# Patient Record
Sex: Male | Born: 1945 | Race: Black or African American | Hispanic: No | Smoking: Never smoker
Health system: Southern US, Community
[De-identification: ages and names within clinical notes are randomized; demographics above are authoritative.]

## PROBLEM LIST (undated history)

## (undated) DIAGNOSIS — Z1621 Resistance to vancomycin: Secondary | ICD-10-CM

## (undated) DIAGNOSIS — J449 Chronic obstructive pulmonary disease, unspecified: Secondary | ICD-10-CM

## (undated) DIAGNOSIS — E119 Type 2 diabetes mellitus without complications: Secondary | ICD-10-CM

## (undated) DIAGNOSIS — R569 Unspecified convulsions: Secondary | ICD-10-CM

## (undated) DIAGNOSIS — A491 Streptococcal infection, unspecified site: Secondary | ICD-10-CM

## (undated) DIAGNOSIS — J9610 Chronic respiratory failure, unspecified whether with hypoxia or hypercapnia: Secondary | ICD-10-CM

## (undated) DIAGNOSIS — Z1612 Extended spectrum beta lactamase (ESBL) resistance: Secondary | ICD-10-CM

## (undated) DIAGNOSIS — A4902 Methicillin resistant Staphylococcus aureus infection, unspecified site: Secondary | ICD-10-CM

## (undated) DIAGNOSIS — I639 Cerebral infarction, unspecified: Secondary | ICD-10-CM

## (undated) DIAGNOSIS — A499 Bacterial infection, unspecified: Secondary | ICD-10-CM

## (undated) HISTORY — PX: OTHER SURGICAL HISTORY: SHX169

## (undated) HISTORY — PX: PEG TUBE PLACEMENT: SUR1034

---

## 2018-03-09 ENCOUNTER — Encounter (HOSPITAL_COMMUNITY): Payer: Self-pay

## 2018-03-09 ENCOUNTER — Inpatient Hospital Stay (HOSPITAL_COMMUNITY)
Admission: EM | Admit: 2018-03-09 | Discharge: 2018-03-23 | DRG: 870 | Disposition: A | Discharge status: Other Acute Inpatient Hospital | Payer: Medicare Other | Attending: Internal Medicine | Admitting: Internal Medicine

## 2018-03-09 ENCOUNTER — Emergency Department (HOSPITAL_COMMUNITY): Payer: Medicare Other

## 2018-03-09 DIAGNOSIS — E11649 Type 2 diabetes mellitus with hypoglycemia without coma: Secondary | ICD-10-CM | POA: Diagnosis not present

## 2018-03-09 DIAGNOSIS — I69354 Hemiplegia and hemiparesis following cerebral infarction affecting left non-dominant side: Secondary | ICD-10-CM

## 2018-03-09 DIAGNOSIS — Z9911 Dependence on respirator [ventilator] status: Secondary | ICD-10-CM

## 2018-03-09 DIAGNOSIS — Z930 Tracheostomy status: Secondary | ICD-10-CM | POA: Diagnosis not present

## 2018-03-09 DIAGNOSIS — Z89611 Acquired absence of right leg above knee: Secondary | ICD-10-CM

## 2018-03-09 DIAGNOSIS — J9611 Chronic respiratory failure with hypoxia: Secondary | ICD-10-CM

## 2018-03-09 DIAGNOSIS — J440 Chronic obstructive pulmonary disease with acute lower respiratory infection: Secondary | ICD-10-CM | POA: Diagnosis not present

## 2018-03-09 DIAGNOSIS — A419 Sepsis, unspecified organism: Principal | ICD-10-CM | POA: Diagnosis present

## 2018-03-09 DIAGNOSIS — I959 Hypotension, unspecified: Secondary | ICD-10-CM | POA: Diagnosis present

## 2018-03-09 DIAGNOSIS — Y95 Nosocomial condition: Secondary | ICD-10-CM | POA: Diagnosis not present

## 2018-03-09 DIAGNOSIS — J189 Pneumonia, unspecified organism: Secondary | ICD-10-CM | POA: Diagnosis present

## 2018-03-09 DIAGNOSIS — J9621 Acute and chronic respiratory failure with hypoxia: Secondary | ICD-10-CM | POA: Diagnosis not present

## 2018-03-09 DIAGNOSIS — L89154 Pressure ulcer of sacral region, stage 4: Secondary | ICD-10-CM | POA: Diagnosis present

## 2018-03-09 DIAGNOSIS — Z66 Do not resuscitate: Secondary | ICD-10-CM | POA: Diagnosis present

## 2018-03-09 DIAGNOSIS — R253 Fasciculation: Secondary | ICD-10-CM | POA: Diagnosis present

## 2018-03-09 DIAGNOSIS — R6520 Severe sepsis without septic shock: Secondary | ICD-10-CM | POA: Diagnosis present

## 2018-03-09 DIAGNOSIS — E039 Hypothyroidism, unspecified: Secondary | ICD-10-CM | POA: Diagnosis not present

## 2018-03-09 DIAGNOSIS — I69321 Dysphasia following cerebral infarction: Secondary | ICD-10-CM

## 2018-03-09 DIAGNOSIS — I10 Essential (primary) hypertension: Secondary | ICD-10-CM | POA: Diagnosis not present

## 2018-03-09 DIAGNOSIS — G40909 Epilepsy, unspecified, not intractable, without status epilepticus: Secondary | ICD-10-CM | POA: Diagnosis present

## 2018-03-09 DIAGNOSIS — L8944 Pressure ulcer of contiguous site of back, buttock and hip, stage 4: Secondary | ICD-10-CM | POA: Diagnosis present

## 2018-03-09 DIAGNOSIS — D649 Anemia, unspecified: Secondary | ICD-10-CM | POA: Diagnosis present

## 2018-03-09 DIAGNOSIS — Z794 Long term (current) use of insulin: Secondary | ICD-10-CM

## 2018-03-09 DIAGNOSIS — N390 Urinary tract infection, site not specified: Secondary | ICD-10-CM

## 2018-03-09 DIAGNOSIS — R000 Tachycardia, unspecified: Secondary | ICD-10-CM | POA: Diagnosis present

## 2018-03-09 DIAGNOSIS — B370 Candidal stomatitis: Secondary | ICD-10-CM | POA: Diagnosis present

## 2018-03-09 DIAGNOSIS — J181 Lobar pneumonia, unspecified organism: Secondary | ICD-10-CM | POA: Diagnosis present

## 2018-03-09 DIAGNOSIS — Z681 Body mass index (BMI) 19 or less, adult: Secondary | ICD-10-CM | POA: Diagnosis not present

## 2018-03-09 DIAGNOSIS — Z933 Colostomy status: Secondary | ICD-10-CM

## 2018-03-09 DIAGNOSIS — E876 Hypokalemia: Secondary | ICD-10-CM | POA: Diagnosis not present

## 2018-03-09 DIAGNOSIS — K9423 Gastrostomy malfunction: Secondary | ICD-10-CM | POA: Diagnosis not present

## 2018-03-09 DIAGNOSIS — Z86718 Personal history of other venous thrombosis and embolism: Secondary | ICD-10-CM

## 2018-03-09 DIAGNOSIS — J9601 Acute respiratory failure with hypoxia: Secondary | ICD-10-CM | POA: Diagnosis present

## 2018-03-09 DIAGNOSIS — L8990 Pressure ulcer of unspecified site, unspecified stage: Secondary | ICD-10-CM | POA: Diagnosis present

## 2018-03-09 DIAGNOSIS — R569 Unspecified convulsions: Secondary | ICD-10-CM

## 2018-03-09 HISTORY — DX: Methicillin resistant Staphylococcus aureus infection, unspecified site: A49.02

## 2018-03-09 HISTORY — DX: Resistance to vancomycin: A49.1

## 2018-03-09 HISTORY — DX: Cerebral infarction, unspecified: I63.9

## 2018-03-09 HISTORY — DX: Unspecified convulsions: R56.9

## 2018-03-09 HISTORY — DX: Resistance to vancomycin: Z16.21

## 2018-03-09 HISTORY — DX: Chronic obstructive pulmonary disease, unspecified: J44.9

## 2018-03-09 HISTORY — DX: Extended spectrum beta lactamase (ESBL) resistance: Z16.12

## 2018-03-09 HISTORY — DX: Bacterial infection, unspecified: A49.9

## 2018-03-09 HISTORY — DX: Type 2 diabetes mellitus without complications: E11.9

## 2018-03-09 HISTORY — DX: Chronic respiratory failure, unspecified whether with hypoxia or hypercapnia: J96.10

## 2018-03-09 LAB — CBC WITH DIFFERENTIAL/PLATELET
BAND NEUTROPHILS: 0 %
BASOS ABS: 0 10*3/uL (ref 0.0–0.1)
BLASTS: 0 %
Basophils Relative: 0 %
EOS ABS: 0 10*3/uL (ref 0.0–0.7)
EOS PCT: 0 %
HEMATOCRIT: 36.1 % — AB (ref 39.0–52.0)
Hemoglobin: 11 g/dL — ABNORMAL LOW (ref 13.0–17.0)
LYMPHS ABS: 0.9 10*3/uL (ref 0.7–4.0)
LYMPHS PCT: 4 %
MCH: 28.9 pg (ref 26.0–34.0)
MCHC: 30.5 g/dL (ref 30.0–36.0)
MCV: 95 fL (ref 78.0–100.0)
METAMYELOCYTES PCT: 0 %
MONOS PCT: 7 %
Monocytes Absolute: 1.6 10*3/uL — ABNORMAL HIGH (ref 0.1–1.0)
Myelocytes: 0 %
NEUTROS ABS: 20.1 10*3/uL — AB (ref 1.7–7.7)
Neutrophils Relative %: 89 %
OTHER: 0 %
PLATELETS: INCREASED 10*3/uL (ref 150–400)
Promyelocytes Absolute: 0 %
RBC: 3.8 MIL/uL — ABNORMAL LOW (ref 4.22–5.81)
RDW: 17.4 % — AB (ref 11.5–15.5)
WBC: 22.6 10*3/uL — ABNORMAL HIGH (ref 4.0–10.5)
nRBC: 0 /100 WBC

## 2018-03-09 LAB — URINALYSIS, ROUTINE W REFLEX MICROSCOPIC
Bilirubin Urine: NEGATIVE
Glucose, UA: NEGATIVE mg/dL
Ketones, ur: NEGATIVE mg/dL
NITRITE: NEGATIVE
PROTEIN: 100 mg/dL — AB
SPECIFIC GRAVITY, URINE: 1.021 (ref 1.005–1.030)
pH: 5 (ref 5.0–8.0)

## 2018-03-09 LAB — I-STAT ARTERIAL BLOOD GAS, ED
ACID-BASE EXCESS: 4 mmol/L — AB (ref 0.0–2.0)
Bicarbonate: 28.6 mmol/L — ABNORMAL HIGH (ref 20.0–28.0)
O2 Saturation: 99 %
PH ART: 7.446 (ref 7.350–7.450)
Patient temperature: 99.5
TCO2: 30 mmol/L (ref 22–32)
pCO2 arterial: 41.8 mmHg (ref 32.0–48.0)
pO2, Arterial: 161 mmHg — ABNORMAL HIGH (ref 83.0–108.0)

## 2018-03-09 LAB — COMPREHENSIVE METABOLIC PANEL
ALBUMIN: 1.8 g/dL — AB (ref 3.5–5.0)
ALT: 36 U/L (ref 17–63)
ANION GAP: 9 (ref 5–15)
AST: 54 U/L — AB (ref 15–41)
Alkaline Phosphatase: 179 U/L — ABNORMAL HIGH (ref 38–126)
BUN: 72 mg/dL — AB (ref 6–20)
CO2: 26 mmol/L (ref 22–32)
Calcium: 9.3 mg/dL (ref 8.9–10.3)
Chloride: 107 mmol/L (ref 101–111)
Creatinine, Ser: 0.69 mg/dL (ref 0.61–1.24)
GFR calc Af Amer: >60 mL/min (ref >60)
GFR calc non Af Amer: >60 mL/min (ref >60)
GLUCOSE: 187 mg/dL — AB (ref 65–99)
POTASSIUM: 5.2 mmol/L — AB (ref 3.5–5.1)
Sodium: 142 mmol/L (ref 135–145)
Total Bilirubin: 1.1 mg/dL (ref 0.3–1.2)
Total Protein: 9.8 g/dL — ABNORMAL HIGH (ref 6.5–8.1)

## 2018-03-09 LAB — I-STAT CG4 LACTIC ACID, ED
LACTIC ACID, VENOUS: 2.15 mmol/L — AB (ref 0.5–1.9)
Lactic Acid, Venous: 3.1 mmol/L (ref 0.5–1.9)

## 2018-03-09 MED ORDER — VANCOMYCIN HCL IN DEXTROSE 750-5 MG/150ML-% IV SOLN
750.0000 mg | Freq: Once | INTRAVENOUS | Status: DC
  Filled 2018-03-09: qty 150

## 2018-03-09 MED ORDER — ALBUTEROL SULFATE (2.5 MG/3ML) 0.083% IN NEBU: 2.5000 mg | INHALATION_SOLUTION | RESPIRATORY_TRACT | Status: DC | PRN

## 2018-03-09 MED ORDER — SODIUM CHLORIDE 0.9 % IV SOLN
1.0000 g | Freq: Once | INTRAVENOUS | Status: AC
  Administered 2018-03-09: 1 g via INTRAVENOUS
  Filled 2018-03-09: qty 1

## 2018-03-09 MED ORDER — LACTATED RINGERS IV BOLUS
1000.0000 mL | Freq: Once | INTRAVENOUS | Status: AC
  Administered 2018-03-09: 1000 mL via INTRAVENOUS

## 2018-03-09 MED ORDER — PANTOPRAZOLE SODIUM 40 MG IV SOLR
40.0000 mg | INTRAVENOUS | Status: DC
  Administered 2018-03-10 – 2018-03-11 (×3): 40 mg via INTRAVENOUS
  Filled 2018-03-09 (×3): qty 40

## 2018-03-09 MED ORDER — IPRATROPIUM-ALBUTEROL 0.5-2.5 (3) MG/3ML IN SOLN
3.0000 mL | Freq: Four times a day (QID) | RESPIRATORY_TRACT | Status: DC
  Administered 2018-03-10 – 2018-03-14 (×17): 3 mL via RESPIRATORY_TRACT
  Filled 2018-03-09 (×16): qty 3

## 2018-03-09 MED ORDER — LACTATED RINGERS IV BOLUS
500.0000 mL | Freq: Once | INTRAVENOUS | Status: AC
  Administered 2018-03-09: 500 mL via INTRAVENOUS

## 2018-03-09 MED ORDER — NYSTATIN 100000 UNIT/ML MT SUSP
5.0000 mL | Freq: Four times a day (QID) | OROMUCOSAL | Status: DC
  Administered 2018-03-10 – 2018-03-20 (×41): 500000 [IU] via ORAL
  Filled 2018-03-09 (×43): qty 5

## 2018-03-09 MED ORDER — PIPERACILLIN-TAZOBACTAM 3.375 G IVPB 30 MIN
3.3750 g | Freq: Once | INTRAVENOUS | Status: AC
  Administered 2018-03-09: 3.375 g via INTRAVENOUS
  Filled 2018-03-09: qty 50

## 2018-03-09 MED ORDER — CHLORHEXIDINE GLUCONATE 0.12% ORAL RINSE (MEDLINE KIT)
15.0000 mL | Freq: Two times a day (BID) | OROMUCOSAL | Status: DC
  Administered 2018-03-10 – 2018-03-11 (×2): 15 mL via OROMUCOSAL

## 2018-03-09 MED ORDER — ORAL CARE MOUTH RINSE
15.0000 mL | Freq: Four times a day (QID) | OROMUCOSAL | Status: DC
  Administered 2018-03-10 – 2018-03-11 (×2): 15 mL via OROMUCOSAL

## 2018-03-09 MED ORDER — SODIUM CHLORIDE 0.9 % IV SOLN
1.0000 g | Freq: Three times a day (TID) | INTRAVENOUS | Status: AC
  Administered 2018-03-10 – 2018-03-15 (×18): 1 g via INTRAVENOUS
  Filled 2018-03-09 (×19): qty 1

## 2018-03-09 MED ORDER — LINEZOLID 600 MG/300ML IV SOLN
600.0000 mg | Freq: Two times a day (BID) | INTRAVENOUS | Status: AC
  Administered 2018-03-09 – 2018-03-15 (×13): 600 mg via INTRAVENOUS
  Filled 2018-03-09 (×14): qty 300

## 2018-03-09 NOTE — ED Notes (Signed)
I stat lactic acid reported to Dr. Clarene DukeLittle by B. Bing PlumeHaynes, EMT

## 2018-03-09 NOTE — ED Triage Notes (Signed)
Pt arrived via [facility] from [facility].  Spoke with [place] Mark from [facility] stated she advised transport d/t abnormal ABG, CBG 329, tachycardic at 145, and agonal labored breathing.  Reported pt is being treated for MRSA, VRE, ESBL in his sputum and urine.  Right arm PICC line, G-tube is leaking.

## 2018-03-09 NOTE — Consult Note (Signed)
Name: Joe Sandoval MRN: [id number] DOB: 1946-08-24    ADMISSION DATE:  03/09/2018 CONSULTATION DATE:  03/09/2018  REFERRING MD :  Dr. Clarene DukeLittle  CHIEF COMPLAINT:  Hypotension/ VDRF  HISTORY OF PRESENT ILLNESS:   HPI obtained from medical chart review as patient is trach/ VDRF, will not follow commands, and unable to participate in history.    72 year old male with past medical history of ventilator dependent respiratory failure s/p tracheostomy, G-tube dependent, COPD, CVA with residual left hemiplegia, DM, MDRO including EBSL, MRSA, VRE, and seizures who is a resident at [facility] with state gold DNR form present.  Patient who was sent for fever, respiratory distress including agonal breathing onset today.  Patient recently finished course of vancomycin and cefepime 5 days ago.    On arrival, patient was in no respiratory distress on MV and neurologically at baseline.  Concern for sepsis given fever, tachycardia, and borderline hypotension based on measurements taken on left lower leg.  CXR showing RLL inflitrate, UTI noted, WBC 22.6, K 5.2, sCr 0.69, lactate 2.15 -> 3.1. Cultures sent.  He was started on meropenum and linezolid and started on 30 ml/kg fluid resusitation with improvement in blood pressure.  At this time, patient does not meet ICU criteria and PCCM consulted for ventilator management.    PAST MEDICAL HISTORY :   has a past medical history of Chronic respiratory failure (HCC), COPD (chronic obstructive pulmonary disease) (HCC), Diabetes mellitus without complication (HCC), ESBL (extended spectrum beta-lactamase) producing bacteria infection, MRSA (methicillin resistant Staphylococcus aureus), Seizures (HCC), Stroke (HCC), and VRE (vancomycin-resistant Enterococci).  has no past surgical history on file. Prior to Admission medications   Medication Sig Start Date End Date Taking? Authorizing Provider  acetaminophen (TYLENOL) 325 MG tablet Place 650 mg into feeding tube every 4 (four)  hours as needed for fever (pain).   Yes [provider]  chlorhexidine (PERIDEX) 0.12 % solution 15 mLs See admin instructions. Give 15 mls by mouth every shift for attention to trach   Yes [provider]  [employer] Tar Extract (NEUTROGENA T/GEL) 4 % SHAM Apply 1 application topically See admin instructions. Apply to head topically at bedtime every Wednesday and Sunday (wash hair with bath)   Yes [provider]  dextrose 50 % solution Inject 12.5 g into the vein See admin instructions. Use 12.5 gram intravenously as needed for BS below 70 and patient has iv access   Yes [provider]  Dextrose-Fructose-Sod Citrate [id number] MG CHEW 1 tablet by PEG Tube route as needed (BS <70).   Yes [provider]  Enoxaparin Sodium (LOVENOX IJ) Inject 50 mg into the skin every 12 (twelve) hours.    Yes [provider]  FLORA-Q (FLORA-Q) CAPS capsule 1 capsule daily.   Yes [provider]  FOLIC ACID PO 5 mg by PEG Tube route daily. 5 mg tablets   Yes [provider]  glucagon (GLUCAGON EMERGENCY) 1 MG injection Inject 1 mg into the muscle as needed (BS <70 (use if patient cannot swallow)).   Yes [provider]  insulin lispro (HUMALOG) 100 UNIT/ML injection Inject 0-10 Units into the skin every 6 (six) hours. Per sliding scale: CBG 15-200 2 units, 201-250, 251-300 6 units, 301-350 8 units, 351-400 10 units   Yes [provider]  ipratropium-albuterol (DUONEB) 0.5-2.5 (3) MG/3ML SOLN Take 3 mLs by nebulization every 6 (six) hours.   Yes [provider]  levETIRAcetam (KEPPRA) 100 MG/ML solution Place 1,500 mg into  feeding tube 2 (two) times daily. For seizures   Yes [provider]  levothyroxine (SYNTHROID, LEVOTHROID) 50 MCG tablet Place 50 mcg into feeding tube daily.   Yes [provider]  LORazepam (ATIVAN) 2 MG tablet Place 2 mg into feeding tube every 4 (four) hours as needed for seizure.   Yes  [provider]  metoprolol tartrate (LOPRESSOR) 50 MG tablet Place 50 mg into feeding tube 2 (two) times daily. Hold for BP less than 100   Yes [provider]  morphine 20 MG/5ML solution Place 4 mg into feeding tube every 4 (four) hours as needed for pain (agitation).   Yes [provider]  Multiple Vitamin (MULTIVITAMIN WITH MINERALS) TABS tablet Place 1 tablet into feeding tube daily.   Yes [provider]  nystatin (NYSTATIN) powder Apply topically See admin instructions. Apply to colostomy site topically every day shift every 3 days for colostomy change   Yes [provider]  ondansetron (ZOFRAN) 4 MG tablet Place 4 mg into feeding tube every 4 (four) hours as needed for nausea or vomiting.   Yes [provider]  pantoprazole sodium (PROTONIX) 40 mg/20 mL PACK Place 40 mg into feeding tube daily.   Yes [provider]  phenytoin (DILANTIN) 125 MG/5ML suspension Place 100 mg into feeding tube every 8 (eight) hours. For seizures   Yes [provider]  polyethylene glycol (MIRALAX / GLYCOLAX) packet Place 17 g into feeding tube daily.   Yes [provider]  QUEtiapine (SEROQUEL) 25 MG tablet Place 25 mg into feeding tube 2 (two) times daily. For agitation and restlessness   Yes [provider]  Skin Protectants, Misc. (EUCERIN) cream Apply 1 application topically See admin instructions. Apply topically to dry skin every shift   Yes [provider]  Sodium Chloride Flush (NORMAL SALINE FLUSH IV) Inject 10 mLs into the vein See admin instructions. Use 10 ml intravenously every shift for PICC Valved Catheter. Flush each unused lumen.   Yes [provider]  Sodium Chloride, Inhalant, (HYPER-SAL) 7 % NEBU Inhale 1 vial into the lungs every 6 (six) hours.   Yes [provider]  traMADol (ULTRAM) 50 MG tablet Place 50 mg into feeding tube every 6 (six) hours as needed (pain 4-6).   Yes  [provider]   Allergies  Allergen Reactions  . Iodine Other (See Comments)    Unknown reaction (listed on [facility] paperwork)  . Shellfish Allergy Other (See Comments)    Unknown reaction (listed on [facility] Paperwork    FAMILY HISTORY:  family history is not on file. SOCIAL HISTORY:  reports that he drank alcohol. He reports that he has current or past drug history.  REVIEW OF SYSTEMS:   Unable to assess as patient is non-verbal, does not follow commands, and is mechanically ventilated.   SUBJECTIVE:  S/p 1L LR  VITAL SIGNS: Temp:  [99.5 F (37.5 C)] 99.5 F (37.5 C) (03/25 1908) Pulse Rate:  [71-133] 123 (03/25 2231) Resp:  [17-26] 20 (03/25 2231) BP: (74-143)/(34-81) 142/61 (03/25 2231) SpO2:  [99 %-100 %] 100 % (03/25 2231) FiO2 (%):  [40 %] 40 % (03/25 2038) Weight:  [111 lb (50.3 kg)-111 lb 12.8 oz (50.7 kg)] 111 lb (50.3 kg) (03/25 2024)  PHYSICAL EXAMINATION: General:  Chronically ill appearing male in NAD on MV  HEENT: MM pink/heavy white coating to tongue- does not scrape off, pupils 3/ equal, 6 shiley XLT-D midline Neuro: Eyes open, looks around room,  moves RUE actively- will localize, flaccid left, does not follow commands CV: ST, +1 pulses PULM: even/non-labored on MV, tachypneic, lungs bilaterally diminished, faint expiratory wheeze with scattered rhonchi/rales, thick green sputum suctioned GI: soft, non-tender, bs active, Gtube to upper abd with leakage and excoriation around site, LLQ ostomy with mustard colored liquid stool  Extremities: warm/dry, mild generalized edema, R AKA  Skin: no rashes, 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 FINDINGS: Endotracheal tube 5.8 cm above the carina. Right PICC line tip in the SVC. Heart is normal size. Mild hyperinflation of the lungs. Patchy opacity at the right lung base. Left lung clear. No effusions. No acute bony abnormality. IMPRESSION: Mild hyperinflation. Patchy atelectasis or infiltrate at the right lung base. Electronically Signed   By: Charlett Nose M.D.   On: 03/09/2018 18:44   SIGNIFICANT EVENTS  3/25 Admitted  STUDIES:  CXR 3/25 >> mild hyperinflation; patchy atelectasis or infiltrate at right lung base  CULTURES:  3/25 BC x2 >> 3/25 UC >> 3/25 trach aspirate >>  ANTIBIOTICS: pta Vanc and cefepime 3/25 linezolid >> 3/25 meropenem >>  Lines/drains: (all from outside facility) DL R PICC Foley Shiley 6 trach XLT distal Gtube Ostomy  PIV x 1   BRIEF PATIENT DESCRIPTION:  72 yoM, w/PMH of DNR, VDRF/ trach/ MDRO, Gtube dependent with [facility], agonal ?on MV with respiratory distress, presented to ER in no distress, at baseline mental status with soft BP (taken on lower extremity), tachycardic, lactate of 2.15-> 3.1 with concern for Sepsis found to have RLL PNA, UTI, started on 6ml/kg fluid bolus with improvement in BP to baseline and on LUE.     ASSESSMENT / PLAN:  Chronic respiratory failure with tracheostomy and VDRF HCAP- hx of MDS, s/p course of vanc and cefepime at [facility] Oral thrush  COPD - CXR with satisfactory position of trach, RLL infiltrate  P:  Does not meet ICU criteria at this time, he is normotensive, DNR, would not start vasopressors  Continue full MV support, PRVC 8 cc/kg, rate 22 No SBT for now  CXR in am  ABG now Send for tracheal aspirate Agree with Meropenum and Zyvox and narrow as able given  culture data (given hx of VRE and ESBL, no records of culture data available at time of note) Assess PCT  Consider ID consult Nystatin orally  Trach care protocol  duonebs q 6hrs and albuterol PRN  PPI for SUP    Remainder per primary team    Global: No family at bedside.  ED unable to reach family.  Patient sent with valid state GOLD DNR form in which we will honor.   Posey Boyer, AGACNP-BC [facility] Pgr: [phone] or if no answer [phone] 03/09/2018, 10:53 PM

## 2018-03-09 NOTE — ED Provider Notes (Signed)
[facility] EMERGENCY DEPARTMENT Provider Note   CSN: [id number] Arrival date & time: 03/09/18  1751     History   Chief Complaint Chief Complaint  Patient presents with  . Respiratory Distress  . Tachycardia    HPI Joe Sandoval is a 72 y.o. male.  The history is provided by the EMS personnel and medical records.  Shortness of Breath  This is a recurrent problem. Duration: unclear. The current episode started less than 1 hour ago. The problem has been resolved (EMS on scene and critical care transport did not note reported agonal respirations that were reported. ). Associated symptoms include a fever (Documented fever at [facility] earlier today, got acetaminophen). Associated medical issues include COPD.  -Patient is a 72 year old male who is ventilator and G-tube dependent who presents from [facility] for reported agonal respirations and shortness of breath.  He also reportedly spiked a fever today.  Patient recently finished up a course of cefepime and vancomycin for pneumonia.  He also has severe decubitus wounds.  EMS on scene and local care transport did not note any agonal respirations or signs of respiratory distress during their time with the patient.  Past Medical History:  Diagnosis Date  . Chronic respiratory failure (HCC)   . COPD (chronic obstructive pulmonary disease) (HCC)   . Diabetes mellitus without complication (HCC)   . ESBL (extended spectrum beta-lactamase) producing bacteria infection   . MRSA (methicillin resistant Staphylococcus aureus)   . Seizures (HCC)   . Stroke (HCC)   . VRE (vancomycin-resistant Enterococci)     There are no active problems to display for this patient.   History reviewed. No pertinent surgical history.      Home Medications    Prior to Admission medications   Not on File    Family History History reviewed. No pertinent family history.  Social History Social History   Tobacco Use  . Smoking  status: Unknown If Ever Smoked  Substance Use Topics  . Alcohol use: Not Currently    Frequency: Never  . Drug use: Not Currently     Allergies   Iodine and Shellfish allergy   Review of Systems Review of Systems  Unable to perform ROS: Patient nonverbal  Constitutional: Positive for fever (Documented fever at [facility] earlier today, got acetaminophen).  Respiratory: Positive for shortness of breath.      Physical Exam Updated Vital Signs BP 118/81   Pulse (!) 116   Temp 99.5 F (37.5 C) (Rectal)   Resp 17   Ht 6' (1.829 m)   Wt 50.3 kg (111 lb)   SpO2 100%   BMI 15.05 kg/m   Physical Exam  Constitutional: He appears well-developed and well-nourished. He appears listless. No distress.  HENT:  Head: Normocephalic and atraumatic.  Oral thrush  Eyes: Conjunctivae are normal.  Neck: Neck supple.  Cardiovascular: Normal rate and regular rhythm.  Pulmonary/Chest: Effort normal and breath sounds normal. No respiratory distress. He has no wheezes. He has no rales.  Abdominal: Soft. He exhibits no distension. There is no tenderness. There is no guarding.  Gtube in place  Musculoskeletal: He exhibits no edema.  R AKA  Neurological: He appears listless.  Awake, looking around room.  Skin: Skin is warm and dry.  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  Does not appear acutely infected.  There is some redness and mild induration on the right side of the abdomen that may be cellulitic.  There is  also small cluster of ulcerations in the left groin.  Psychiatric: He has a normal mood and affect.  Nursing note and vitals reviewed.    ED Treatments / Results  Labs (all labs ordered are listed, but only abnormal results are displayed) Labs Reviewed  COMPREHENSIVE METABOLIC PANEL - Abnormal; Notable for the following components:      Result Value   Potassium 5.2 (*)    Glucose, Bld 187 (*)    BUN 72 (*)    Total Protein 9.8 (*)    Albumin 1.8 (*)     AST 54 (*)    Alkaline Phosphatase 179 (*)    All other components within normal limits  CBC WITH DIFFERENTIAL/PLATELET - Abnormal; Notable for the following components:   RBC 3.80 (*)    Hemoglobin 11.0 (*)    HCT 36.1 (*)    RDW 17.4 (*)    All other components within normal limits  URINALYSIS, ROUTINE W REFLEX MICROSCOPIC - Abnormal; Notable for the following components:   Color, Urine AMBER (*)    APPearance TURBID (*)    Hgb urine dipstick MODERATE (*)    Protein, ur 100 (*)    Leukocytes, UA LARGE (*)    Bacteria, UA FEW (*)    Squamous Epithelial / LPF 0-5 (*)    All other components within normal limits  I-STAT CG4 LACTIC ACID, ED - Abnormal; Notable for the following components:   Lactic Acid, Venous 2.15 (*)    All other components within normal limits  I-STAT CG4 LACTIC ACID, ED - Abnormal; Notable for the following components:   Lactic Acid, Venous 3.10 (*)    All other components within normal limits  CULTURE, BLOOD (ROUTINE X 2)  CULTURE, BLOOD (ROUTINE X 2)  URINE CULTURE  CULTURE, BLOOD (SINGLE)    EKG EKG Interpretation  Date/Time:  Monday March 09 2018 18:09:23 EDT Ventricular Rate:  136 PR Interval:    QRS Duration: 84 QT Interval:  304 QTC Calculation: 458 R Axis:   64 Text Interpretation:  Sinus tachycardia LAE, consider biatrial enlargement No previous ECGs available Confirmed by Frederick Peers [phone]) on 03/09/2018 7:27:10 PM   Radiology Dg Chest Port 1 View  Result Date: 03/09/2018 CLINICAL DATA:  Recent pneumonia, sepsis EXAM: PORTABLE CHEST 1 VIEW COMPARISON:  None. FINDINGS: Endotracheal tube 5.8 cm above the carina. Right PICC line tip in the SVC. Heart is normal size. Mild hyperinflation of the lungs. Patchy opacity at the right lung base. Left lung clear. No effusions. No acute bony abnormality. IMPRESSION: Mild hyperinflation. Patchy atelectasis or infiltrate at the right lung base. Electronically Signed   By: Charlett Nose M.D.   On:  03/09/2018 18:44    Procedures Procedures (including critical care time)  Medications Ordered in ED Medications  linezolid (ZYVOX) IVPB 600 mg (has no administration in time range)  meropenem (MERREM) 1 g in sodium chloride 0.9 % 100 mL IVPB (has no administration in time range)  meropenem (MERREM) 1 g in sodium chloride 0.9 % 100 mL IVPB (1 g Intravenous New Bag/Given 03/09/18 2023)  piperacillin-tazobactam (ZOSYN) IVPB 3.375 g (0 g Intravenous Stopped 03/09/18 1908)  lactated ringers bolus 1,000 mL (1,000 mLs Intravenous Given 03/09/18 1838)  lactated ringers bolus 500 mL (500 mLs Intravenous Given 03/09/18 1919)     Initial Impression / Assessment and Plan / ED Course  I have reviewed the triage vital signs and the nursing notes.  Pertinent labs & imaging results that were available  during my care of the patient were reviewed by me and considered in my medical decision making (see chart for details).     Patient is a 72 year old male with history of stroke, COPD, chronic respiratory failure on ventilator support, tracheostomy status, G-tube dependent, diabetes, ESBL, MRSA, VRE, seizures who presents with reported agonal breathing and tachycardia and fever.  Critical care transport did not note any agonal breathing and he is reportedly at his normal mental status.  Patient is afebrile here however he received Tylenol and morphine prior to arrival.  Blood pressure is soft.  No respiratory distress noted.  Patient recently finished a course of vancomycin yesterday and course of cefepime 5 days ago per the nursing home.  This was for treatment of pneumonia.    Given the patient is receiving and had reported respiratory symptoms and tachycardia, concern for sepsis.  He also has numerous multidrug-resistant organisms so we will cover broadly.  Initially vancomycin and Zosyn ordered, however pharmacy is recommending meropenem and linezolid.  This is approved through infectious disease.  Chest  x-ray does show possible right lower lobe pneumonia.  Urine has large leukocytes but negative nitrites and few bacteria.  Blood and urine culture sent.  Patient resuscitated with 1.5 L of LR which is 30 cc/KG.  Initial lactate was 2.15 and is slightly higher on repeat.  Blood pressure did improve with the fluid bolus.  Blood cultures also drawn from his PICC line to evaluate for line sepsis.  Patient likely has multiple sources of infection causing his sepsis including pneumonia, UTI, possible cellulitis on his anterior abdominal wall.  He has large stage IV pressure ulcers on his bilateral posterior hips and sacrum but they do not appear acutely infected.  Given the patient's comorbidities, sepsis, ventilator dependent, patient will need to be admitted to the ICU for further management.  Final Clinical Impressions(s) / ED Diagnoses   Final diagnoses:  HCAP (healthcare-associated pneumonia)  Urinary tract infection without hematuria, site unspecified  Sepsis, due to unspecified organism [facility](HCC)  Ventilator dependent [facility](HCC)    ED Discharge Orders    None       Dwana MelenaDong, Giovoni Bunch, DO 03/09/18 2028    Little, Ambrose Finlandachel Morgan, MD 03/11/18 [phone]

## 2018-03-09 NOTE — Progress Notes (Signed)
Pharmacy Antibiotic Note  Joe Sandoval is a 72 y.o. male admitted on 03/09/2018 from [facility] with abnormal ABG, hyperglycemia, tachycardia and agonal breathing.  Per RN at [facility], patient just completed a course of vancomycin and cefepime for PNA.  Pharmacy has been consulted for Merrem dosing to cover PNA, UTI and decub ulcer given history of ESBL.  ID also approved using Zyvox due to history of VRE and MRSA.  SCr 0.69, CrCL 60 ml/min, afebrile, LA increased to 3.1.   Plan: Zyvox  IV Q12H Merrem 1gm IV Q8H Monitor renal fxn, clinical progress   Height: 6' (182.9 cm) Weight: 111 lb (50.3 kg) IBW/kg (Calculated) : 77.6  Temp (24hrs), Avg:99.5 F (37.5 C), Min:99.5 F (37.5 C), Max:99.5 F (37.5 C)  Recent Labs  Lab 03/09/18 1831 03/09/18 1845 03/09/18 2025  WBC PENDING  --   --   CREATININE 0.69  --   --   LATICACIDVEN  --  2.15* 3.10*    Estimated Creatinine Clearance: 60.3 mL/min (by C-G formula based on SCr of 0.69 mg/dL).    Allergies  Allergen Reactions  . Iodine   . Shellfish Allergy     Merrem 3/25 >> Zyvox 3/25 >>  3/25 BCx -  3/25 UCx -   Info from RN at [facility]: Allergy: shellfish and iodine - unknown reaction 111.8 lbs, 6' (right leg amputation)  Vanc  IV Q12H, 5/14 >> 5/24 (VT = 15.9 on 3/22, SCr 0.33 ) Cefepime 1gm IV Q12H, 5/13 >> 5/20 3/13 sputum cx - negative hx MRSA, VRE, ESBL in sputum and urine    Caitrin Pendergraph D. Laney Potash, PharmD, BCPS Pager:  [phone] 03/09/2018, 8:31 PM

## 2018-03-09 NOTE — ED Notes (Signed)
Inserted foley but was not a temp foley.

## 2018-03-10 ENCOUNTER — Inpatient Hospital Stay (HOSPITAL_COMMUNITY): Payer: Medicare Other

## 2018-03-10 ENCOUNTER — Encounter (HOSPITAL_COMMUNITY): Payer: Self-pay | Admitting: Internal Medicine

## 2018-03-10 DIAGNOSIS — R569 Unspecified convulsions: Secondary | ICD-10-CM

## 2018-03-10 DIAGNOSIS — J9601 Acute respiratory failure with hypoxia: Secondary | ICD-10-CM | POA: Diagnosis not present

## 2018-03-10 DIAGNOSIS — A419 Sepsis, unspecified organism: Secondary | ICD-10-CM | POA: Diagnosis not present

## 2018-03-10 DIAGNOSIS — L8990 Pressure ulcer of unspecified site, unspecified stage: Secondary | ICD-10-CM | POA: Diagnosis present

## 2018-03-10 DIAGNOSIS — K9423 Gastrostomy malfunction: Secondary | ICD-10-CM | POA: Diagnosis present

## 2018-03-10 LAB — PROTIME-INR
INR: 1.21
Prothrombin Time: 15.2 seconds (ref 11.4–15.2)

## 2018-03-10 LAB — CBC WITH DIFFERENTIAL/PLATELET
BASOS ABS: 0 10*3/uL (ref 0.0–0.1)
Basophils Relative: 0 %
EOS ABS: 0.5 10*3/uL (ref 0.0–0.7)
EOS PCT: 3 %
HCT: 30.9 % — ABNORMAL LOW (ref 39.0–52.0)
Hemoglobin: 9 g/dL — ABNORMAL LOW (ref 13.0–17.0)
Lymphocytes Relative: 8 %
Lymphs Abs: 1.3 10*3/uL (ref 0.7–4.0)
MCH: 27.3 pg (ref 26.0–34.0)
MCHC: 29.1 g/dL — ABNORMAL LOW (ref 30.0–36.0)
MCV: 93.6 fL (ref 78.0–100.0)
MONO ABS: 1 10*3/uL (ref 0.1–1.0)
Monocytes Relative: 6 %
NEUTROS PCT: 83 %
Neutro Abs: 13.6 10*3/uL — ABNORMAL HIGH (ref 1.7–7.7)
PLATELETS: 392 10*3/uL (ref 150–400)
RBC: 3.3 MIL/uL — AB (ref 4.22–5.81)
RDW: 16.9 % — ABNORMAL HIGH (ref 11.5–15.5)
WBC: 16.4 10*3/uL — AB (ref 4.0–10.5)

## 2018-03-10 LAB — GLUCOSE, CAPILLARY
GLUCOSE-CAPILLARY: 110 mg/dL — AB (ref 65–99)
GLUCOSE-CAPILLARY: 74 mg/dL (ref 65–99)
Glucose-Capillary: 88 mg/dL (ref 65–99)

## 2018-03-10 LAB — I-STAT VENOUS BLOOD GAS, ED
ACID-BASE EXCESS: 4 mmol/L — AB (ref 0.0–2.0)
Bicarbonate: 30.1 mmol/L — ABNORMAL HIGH (ref 20.0–28.0)
O2 SAT: 54 %
PO2 VEN: 31 mmHg — AB (ref 32.0–45.0)
Patient temperature: 99.5
TCO2: 32 mmol/L (ref 22–32)
pCO2, Ven: 53.9 mmHg (ref 44.0–60.0)
pH, Ven: 7.357 (ref 7.250–7.430)

## 2018-03-10 LAB — COMPREHENSIVE METABOLIC PANEL
ALBUMIN: 1.6 g/dL — AB (ref 3.5–5.0)
ALT: 23 U/L (ref 17–63)
AST: 21 U/L (ref 15–41)
Alkaline Phosphatase: 134 U/L — ABNORMAL HIGH (ref 38–126)
Anion gap: 7 (ref 5–15)
BUN: 39 mg/dL — AB (ref 6–20)
CHLORIDE: 111 mmol/L (ref 101–111)
CO2: 26 mmol/L (ref 22–32)
CREATININE: 0.4 mg/dL — AB (ref 0.61–1.24)
Calcium: 8.9 mg/dL (ref 8.9–10.3)
GFR calc Af Amer: >60 mL/min (ref >60)
GFR calc non Af Amer: >60 mL/min (ref >60)
GLUCOSE: 112 mg/dL — AB (ref 65–99)
Potassium: 3.9 mmol/L (ref 3.5–5.1)
SODIUM: 144 mmol/L (ref 135–145)
Total Bilirubin: 0.3 mg/dL (ref 0.3–1.2)
Total Protein: 8 g/dL (ref 6.5–8.1)

## 2018-03-10 LAB — PROCALCITONIN
PROCALCITONIN: 0.13 ng/mL
Procalcitonin: 0.18 ng/mL

## 2018-03-10 LAB — RENAL FUNCTION PANEL
ALBUMIN: 1.6 g/dL — AB (ref 3.5–5.0)
Anion gap: 8 (ref 5–15)
BUN: 38 mg/dL — AB (ref 6–20)
CALCIUM: 9 mg/dL (ref 8.9–10.3)
CO2: 27 mmol/L (ref 22–32)
Chloride: 110 mmol/L (ref 101–111)
Creatinine, Ser: 0.39 mg/dL — ABNORMAL LOW (ref 0.61–1.24)
GFR calc Af Amer: >60 mL/min (ref >60)
GFR calc non Af Amer: >60 mL/min (ref >60)
GLUCOSE: 111 mg/dL — AB (ref 65–99)
PHOSPHORUS: 2.7 mg/dL (ref 2.5–4.6)
POTASSIUM: 3.9 mmol/L (ref 3.5–5.1)
SODIUM: 145 mmol/L (ref 135–145)

## 2018-03-10 LAB — CBG MONITORING, ED
GLUCOSE-CAPILLARY: 107 mg/dL — AB (ref 65–99)
Glucose-Capillary: 103 mg/dL — ABNORMAL HIGH (ref 65–99)
Glucose-Capillary: 110 mg/dL — ABNORMAL HIGH (ref 65–99)

## 2018-03-10 LAB — LACTIC ACID, PLASMA: Lactic Acid, Venous: 1.3 mmol/L (ref 0.5–1.9)

## 2018-03-10 LAB — PHOSPHORUS: Phosphorus: 2.8 mg/dL (ref 2.5–4.6)

## 2018-03-10 LAB — PHENYTOIN LEVEL, TOTAL

## 2018-03-10 LAB — APTT: APTT: 31 s (ref 24–36)

## 2018-03-10 LAB — MAGNESIUM: MAGNESIUM: 2 mg/dL (ref 1.7–2.4)

## 2018-03-10 MED ORDER — ONDANSETRON HCL 4 MG PO TABS
4.0000 mg | ORAL_TABLET | Freq: Four times a day (QID) | ORAL | Status: DC | PRN
Start: 2018-03-10 — End: 2018-03-23

## 2018-03-10 MED ORDER — ACETAMINOPHEN 650 MG RE SUPP
650.0000 mg | Freq: Four times a day (QID) | RECTAL | Status: DC | PRN
  Administered 2018-03-12: 650 mg via RECTAL
  Filled 2018-03-10: qty 1

## 2018-03-10 MED ORDER — ENOXAPARIN SODIUM 60 MG/0.6ML ~~LOC~~ SOLN
50.0000 mg | Freq: Two times a day (BID) | SUBCUTANEOUS | Status: DC
  Administered 2018-03-10 – 2018-03-16 (×13): 50 mg via SUBCUTANEOUS
  Filled 2018-03-10 (×13): qty 0.6

## 2018-03-10 MED ORDER — COLLAGENASE 250 UNIT/GM EX OINT
TOPICAL_OINTMENT | Freq: Every day | CUTANEOUS | Status: DC
  Administered 2018-03-11 – 2018-03-23 (×13): via TOPICAL
  Filled 2018-03-10 (×4): qty 30

## 2018-03-10 MED ORDER — HEPARIN SODIUM (PORCINE) 5000 UNIT/ML IJ SOLN
5000.0000 [IU] | Freq: Three times a day (TID) | INTRAMUSCULAR | Status: DC
  Administered 2018-03-10: 5000 [IU] via SUBCUTANEOUS
  Filled 2018-03-10: qty 1

## 2018-03-10 MED ORDER — LEVOTHYROXINE SODIUM 100 MCG IV SOLR
25.0000 ug | Freq: Every day | INTRAVENOUS | Status: DC
  Administered 2018-03-10 – 2018-03-12 (×3): 25 ug via INTRAVENOUS
  Filled 2018-03-10 (×3): qty 5

## 2018-03-10 MED ORDER — ACETAMINOPHEN 325 MG PO TABS
650.0000 mg | ORAL_TABLET | Freq: Four times a day (QID) | ORAL | Status: DC | PRN
  Administered 2018-03-11 – 2018-03-14 (×3): 650 mg via ORAL
  Filled 2018-03-10 (×3): qty 2

## 2018-03-10 MED ORDER — INSULIN ASPART 100 UNIT/ML ~~LOC~~ SOLN: 0.0000 [IU] | SUBCUTANEOUS | Status: DC

## 2018-03-10 MED ORDER — LEVETIRACETAM IN NACL 1500 MG/100ML IV SOLN
1500.0000 mg | Freq: Two times a day (BID) | INTRAVENOUS | Status: DC
  Administered 2018-03-10 – 2018-03-12 (×5): 1500 mg via INTRAVENOUS
  Filled 2018-03-10 (×6): qty 100

## 2018-03-10 MED ORDER — ONDANSETRON HCL 4 MG/2ML IJ SOLN: 4.0000 mg | Freq: Four times a day (QID) | INTRAMUSCULAR | Status: DC | PRN

## 2018-03-10 MED ORDER — IOPAMIDOL (ISOVUE-300) INJECTION 61%
INTRAVENOUS | Status: AC
  Filled 2018-03-10: qty 30

## 2018-03-10 MED ORDER — LACTATED RINGERS IV SOLN
INTRAVENOUS | Status: AC
  Administered 2018-03-10 (×2): via INTRAVENOUS

## 2018-03-10 MED ORDER — PHENYTOIN SODIUM 50 MG/ML IJ SOLN
100.0000 mg | Freq: Three times a day (TID) | INTRAMUSCULAR | Status: DC
  Administered 2018-03-10 – 2018-03-12 (×7): 100 mg via INTRAVENOUS
  Filled 2018-03-10 (×7): qty 2

## 2018-03-10 NOTE — Consult Note (Addendum)
WOC Nurse wound consult note Reason for Consult: multiple wounds Wound types: Dorsum of right hand:  Unknown etiology, partial thickness, healing re-epithelialization border.  100% pink wound bed.  Apply a foam dressing to this area and change every 3 days. Left lateral foot: Unclear etiology.  Apply betadine to dry, scabbed areas.  Let air dry.  Wrap in kerlex.  Change daily.  Apply Prevalon boot to left foot at all times. Left trochanter:  Pressure injury.  8 cm x 8 cm x unknown depth.  Unstageable PI.  40% necrotic tissue.  Treatment will be Santyl to wound bed, with saline moistened gauze, then ABD pad.  Change daily. Coccyx area:  Unstageable PI.  4.4 cm x 1.2 cm x unknown depth.  90% necrotic tissue. Treatment will be Santyl, saline moistened gauze and ABD pad. Change daily. Left ischium:  14 cm x 6 cm x unknown depth.  40% necrotic tissue. Santyl and saline moistened gauze and ABD pad, change daily. Right ischium:  6.5 cm X 3.5 cm X 0.6 cm. 100% clean and healing PI. Stage 3. Apply foam dressing and change every 2 days. Right ischium satellite lesion:  1.4 cm x 1.7 cm x 0 cm, 100% clean and re-epithelializing.  Foam dressing and change every 2 days. Pressure Injury POA: Yes  Patient also has a LUQ fecal ostomy that is producing light brown pudding consistency stool.  It looks like a convex pouch would be helpful as the stoma is relatively flat. Patient will need an air mattress for pressure relief, and Prevalon boots.  A nutritional consult to optimize wound healing would be appreciated. Thank you for the consult.  Discussed plan of care with the patient and bedside nurse.  WOC nurse will not follow at this time.  Please re-consult the WOC team if needed.  Helmut MusterSherry Joneric Streight, RN, MSN, CWOCN, CNS-BC, pager [phone]

## 2018-03-10 NOTE — H&P (Addendum)
History and Physical    Joe Sandoval ZOX:[id number] DOB: 01-Jun-1946 DOA: 03/09/2018  PCP: Crist Fat, MD  Patient coming from: [facility] acute care facility.  Chief Complaint: Fever and respiratory distress.  HPI: Joe Sandoval is a 72 y.o. male with history of seizures, ventilator dependent respiratory failure, tracheostomy PEG tube placement colostomy, history of ESBL infection COPD seizures and history of stroke with right-sided weakness, right lower extremity amputation was brought to the ER after patient was found to be increasingly febrile with respiratory distress.  Most of the history is obtained from the ER physician and records as patient is nonverbal and no family at the bedside.  ED Course: On arrival patient was found to be hypoxic and febrile was given Ringer lactate bolus following which blood pressure improved.  Lactate was 2.1 which increased to 3.1.  Patient had leukocytosis.  Chest x-ray was showing possible infiltrates.  On exam patient's PEG tube site has leak.  Patient also has a colostomy bag.  Pulmonary critical care was consulted and since patient is a DNR and blood pressures improved hospitalist was requested to admit.  Review of Systems: As per HPI, rest all negative.   Past Medical History:  Diagnosis Date  . Chronic respiratory failure (HCC)   . COPD (chronic obstructive pulmonary disease) (HCC)   . Diabetes mellitus without complication (HCC)   . ESBL (extended spectrum beta-lactamase) producing bacteria infection   . MRSA (methicillin resistant Staphylococcus aureus)   . Seizures (HCC)   . Stroke (HCC)   . VRE (vancomycin-resistant Enterococci)     Past Surgical History:  Procedure Laterality Date  . PEG TUBE PLACEMENT    . tracheosotomy       has an unknown smoking status. He has never used smokeless tobacco. He reports that he drank alcohol. He reports that he has current or past drug history.  Allergies  Allergen Reactions  . Iodine Other  (See Comments)    Unknown reaction (listed on [facility] paperwork)  . Shellfish Allergy Other (See Comments)    Unknown reaction (listed on [facility] Paperwork    Family History  Family history unknown: Yes    Prior to Admission medications   Medication Sig Start Date End Date Taking? Authorizing Provider  acetaminophen (TYLENOL) 325 MG tablet Place 650 mg into feeding tube every 4 (four) hours as needed for fever (pain).   Yes [provider]  chlorhexidine (PERIDEX) 0.12 % solution 15 mLs See admin instructions. Give 15 mls by mouth every shift for attention to trach   Yes [provider]  [employer] Extract (NEUTROGENA T/GEL) 4 % SHAM Apply 1 application topically See admin instructions. Apply to head topically at bedtime every Wednesday and Sunday (wash hair with bath)   Yes [provider]  dextrose 50 % solution Inject 12.5 g into the vein See admin instructions. Use 12.5 gram intravenously as needed for BS below 70 and patient has iv access   Yes [provider]  Dextrose-Fructose-Sod Citrate [phone] MG CHEW 1 tablet by PEG Tube route as needed (BS <70).   Yes [provider]  Enoxaparin Sodium (LOVENOX IJ) Inject 50 mg into the skin every 12 (twelve) hours.    Yes [provider]  FLORA-Q (FLORA-Q) CAPS capsule 1 capsule daily.   Yes [provider]  FOLIC ACID PO 5 mg by PEG Tube route daily. 5 mg tablets   Yes [provider]  glucagon (GLUCAGON EMERGENCY) 1 MG injection Inject 1 mg into  the muscle as needed (BS <70 (use if patient cannot swallow)).   Yes [provider]  insulin lispro (HUMALOG) 100 UNIT/ML injection Inject 0-10 Units into the skin every 6 (six) hours. Per sliding scale: CBG 15-200 2 units, 201-250, 251-300 6 units, 301-350 8 units, 351-400 10 units   Yes [provider]  ipratropium-albuterol (DUONEB) 0.5-2.5 (3) MG/3ML SOLN Take 3 mLs by nebulization every 6 (six) hours.   Yes  [provider]  levETIRAcetam (KEPPRA) 100 MG/ML solution Place 1,500 mg into feeding tube 2 (two) times daily. For seizures   Yes [provider]  levothyroxine (SYNTHROID, LEVOTHROID) 50 MCG tablet Place 50 mcg into feeding tube daily.   Yes [provider]  LORazepam (ATIVAN) 2 MG tablet Place 2 mg into feeding tube every 4 (four) hours as needed for seizure.   Yes [provider]  metoprolol tartrate (LOPRESSOR) 50 MG tablet Place 50 mg into feeding tube 2 (two) times daily. Hold for BP less than 100   Yes [provider]  morphine 20 MG/5ML solution Place 4 mg into feeding tube every 4 (four) hours as needed for pain (agitation).   Yes [provider]  Multiple Vitamin (MULTIVITAMIN WITH MINERALS) TABS tablet Place 1 tablet into feeding tube daily.   Yes [provider]  nystatin (NYSTATIN) powder Apply topically See admin instructions. Apply to colostomy site topically every day shift every 3 days for colostomy change   Yes [provider]  ondansetron (ZOFRAN) 4 MG tablet Place 4 mg into feeding tube every 4 (four) hours as needed for nausea or vomiting.   Yes [provider]  pantoprazole sodium (PROTONIX) 40 mg/20 mL PACK Place 40 mg into feeding tube daily.   Yes [provider]  phenytoin (DILANTIN) 125 MG/5ML suspension Place 100 mg into feeding tube every 8 (eight) hours. For seizures   Yes [provider]  polyethylene glycol (MIRALAX / GLYCOLAX) packet Place 17 g into feeding tube daily.   Yes [provider]  QUEtiapine (SEROQUEL) 25 MG tablet Place 25 mg into feeding tube 2 (two) times daily. For agitation and restlessness   Yes [provider]  Skin Protectants, Misc. (EUCERIN) cream Apply 1 application topically See admin instructions. Apply topically to dry skin every shift   Yes [provider]  Sodium Chloride Flush (NORMAL SALINE FLUSH IV) Inject 10 mLs  into the vein See admin instructions. Use 10 ml intravenously every shift for PICC Valved Catheter. Flush each unused lumen.   Yes [provider]  Sodium Chloride, Inhalant, (HYPER-SAL) 7 % NEBU Inhale 1 vial into the lungs every 6 (six) hours.   Yes [provider]  traMADol (ULTRAM) 50 MG tablet Place 50 mg into feeding tube every 6 (six) hours as needed (pain 4-6).   Yes [provider]    Physical Exam: Vitals:   03/10/18 0130 03/10/18 0200 03/10/18 0203 03/10/18 0338  BP: 131/61 125/61    Pulse: (!) 109 (!) 109    Resp: 18 17    Temp:      TempSrc:      SpO2: 100% 100% 100% 100%  Weight:      Height:          Constitutional: Moderately built and poorly nourished. Vitals:   03/10/18 0130 03/10/18 0200 03/10/18 0203 03/10/18 0338  BP: 131/61 125/61    Pulse: (!) 109 (!) 109    Resp: 18 17    Temp:  TempSrc:      SpO2: 100% 100% 100% 100%  Weight:      Height:       Eyes: Anicteric no pallor. ENMT: No discharge from the ears eyes nose or mouth. Neck: No mass felt.  No neck rigidity. Respiratory: Rhonchi or crepitations. Cardiovascular: S1-S2 heard no murmurs appreciated. Abdomen: Nontender bowel sounds present.  There is PEG tube seen with some discharge around the PEG tube.  Colostomy bag seen. Musculoskeletal: Right lower extremity amputation. Skin: Multiple decubitus ulcers. Neurologic: Patient is encephalopathic and does not follow commands. Psychiatric: Patient is encephalopathic.   Labs on Admission: I have personally reviewed following labs and imaging studies  CBC: Recent Labs  Lab 03/09/18 1831  WBC 22.6*  NEUTROABS 20.1*  HGB 11.0*  HCT 36.1*  MCV 95.0  PLT PLATELETS APPEAR INCREASED   Basic Metabolic Panel: Recent Labs  Lab 03/09/18 1831 03/09/18 2347  NA 142  --   K 5.2*  --   CL 107  --   CO2 26  --   GLUCOSE 187*  --   BUN 72*  --   CREATININE 0.69  --   CALCIUM 9.3  --   MG  --  2.0  PHOS  --  2.8     GFR: Estimated Creatinine Clearance: 60.3 mL/min (by C-G formula based on SCr of 0.69 mg/dL). Liver Function Tests: Recent Labs  Lab 03/09/18 1831  AST 54*  ALT 36  ALKPHOS 179*  BILITOT 1.1  PROT 9.8*  ALBUMIN 1.8*   No results for input(s): LIPASE, AMYLASE in the last 168 hours. No results for input(s): AMMONIA in the last 168 hours. Coagulation Profile: No results for input(s): INR, PROTIME in the last 168 hours. Cardiac Enzymes: No results for input(s): CKTOTAL, CKMB, CKMBINDEX, TROPONINI in the last 168 hours. BNP (last 3 results) No results for input(s): PROBNP in the last 8760 hours. HbA1C: No results for input(s): HGBA1C in the last 72 hours. CBG: No results for input(s): GLUCAP in the last 168 hours. Lipid Profile: No results for input(s): CHOL, HDL, LDLCALC, TRIG, CHOLHDL, LDLDIRECT in the last 72 hours. Thyroid Function Tests: No results for input(s): TSH, T4TOTAL, FREET4, T3FREE, THYROIDAB in the last 72 hours. Anemia Panel: No results for input(s): VITAMINB12, FOLATE, FERRITIN, TIBC, IRON, RETICCTPCT in the last 72 hours. Urine analysis:    Component Value Date/Time   COLORURINE AMBER (A) 03/09/2018 1831   APPEARANCEUR TURBID (A) 03/09/2018 1831   LABSPEC 1.021 03/09/2018 1831   PHURINE 5.0 03/09/2018 1831   GLUCOSEU NEGATIVE 03/09/2018 1831   HGBUR MODERATE (A) 03/09/2018 1831   BILIRUBINUR NEGATIVE 03/09/2018 1831   KETONESUR NEGATIVE 03/09/2018 1831   PROTEINUR 100 (A) 03/09/2018 1831   NITRITE NEGATIVE 03/09/2018 1831   LEUKOCYTESUR LARGE (A) 03/09/2018 1831   Sepsis Labs: @LABRCNTIP (procalcitonin:4,lacticidven:4) )No results found for this or any previous visit (from the past 240 hour(s)).   Radiological Exams on Admission: Dg Chest Port 1 View  Result Date: 03/09/2018 CLINICAL DATA:  Recent pneumonia, sepsis EXAM: PORTABLE CHEST 1 VIEW COMPARISON:  None. FINDINGS: Endotracheal tube 5.8 cm above the carina. Right PICC line tip in the SVC.  Heart is normal size. Mild hyperinflation of the lungs. Patchy opacity at the right lung base. Left lung clear. No effusions. No acute bony abnormality. IMPRESSION: Mild hyperinflation. Patchy atelectasis or infiltrate at the right lung base. Electronically Signed   By: Charlett Nose M.D.   On: 03/09/2018 18:44    EKG: Independently reviewed.  Sinus tachycardia.  Assessment/Plan Principal Problem:   Sepsis (HCC) Active Problems:   Acute respiratory failure with hypoxia (HCC)   HCAP (healthcare-associated pneumonia)   Ventilator dependent (HCC)   Seizure (HCC)   Leaking PEG tube (HCC)   Decubitus ulcer    1. Sepsis -source could be multiple.  Primary concerning for discharge around the PEG tube for which I have ordered CT abdomen.  Patient also has history of ESBL UTI and also possibility of pneumonia.  Appreciate pulmonary critical care consultation.  Patient is on meropenem and Zyvox.  Follow cultures.  Follow CT abdomen pelvis. 2. History of hypertension on metoprolol holding metoprolol due to hypotension on presentation. 3. History of seizures I am ordering Keppra and Dilantin IV.  Check Dilantin levels. 4. Diabetes mellitus type 2 -we will keep patient on sliding scale coverage for now and follow CBGs closely. 5. Hypothyroidism on Synthroid which will be dosed IV. 6. Ventilator dependent respiratory failure being followed by pulmonologist. 7. Anemia -no old labs to compare follow CBC. 8. Multiple decubitus ulcers -which could also be the source of infection.  Wound team consulted. 9. As per the chart patient has history of cerebral venous sinus thrombosis and is on Lovenox full dose.  Discussed with pharmacy to dose it.  Tube feedings are on hold until CT results are available. Will repeat CBC to check platelet counts.   DVT prophylaxis: Lovenox. Code Status: Partial code.  No CPR. Family Communication: No family at the bedside. Disposition Plan: Skilled nursing  facility. Consults called: Pulmonary critical care. Admission status: Inpatient.   Eduard Clos MD [facility] Pager [phone].  If 7PM-7AM, please contact night-coverage [email or web address] Password [facility]  03/10/2018, 3:42 AM

## 2018-03-10 NOTE — Progress Notes (Signed)
Pt admitted to [facility] from ED. Upon assessment pt opens eyes spontaneously, non-purposeful movement of upper extremeties, not withdrawaing from pain, R AKA, pts tongue coated white, oral care provided. Bilat lung sounds diminished. Radial pulses moderate, pedal pulses weak.   Vitals: oxygen at 100%, BP:144/72(92), RR 22, HR 124  1350cc of yellow, clear & odorless urine emptied from pt foley.   Multiple wounds including bilateral hip & sacral wound all open to air on admission to . Wound care consulted & has orders written.  Foam placed on R anterior hand wound.

## 2018-03-10 NOTE — ED Notes (Signed)
Contacted patient placement concerning patient

## 2018-03-10 NOTE — Progress Notes (Signed)
ANTICOAGULATION CONSULT NOTE - Initial Consult  Pharmacy Consult for lovenox Indication: DVT  Allergies  Allergen Reactions  . Iodine Other (See Comments)    Unknown reaction (listed on [facility] paperwork)  . Shellfish Allergy Other (See Comments)    Unknown reaction (listed on [facility] Paperwork    Patient Measurements: Height: 6' (182.9 cm) Weight: 111 lb (50.3 kg) IBW/kg (Calculated) : 77.6   Vital Signs: Temp: 99.5 F (37.5 C) (03/25 1908) Temp Source: Rectal (03/25 1908) BP: 107/60 (03/26 0545) Pulse Rate: 80 (03/26 0545)  Labs: Recent Labs    03/09/18 1831 03/10/18 0408  HGB 11.0* 9.0*  HCT 36.1* 30.9*  PLT PLATELETS APPEAR INCREASED 392  APTT  --  31  LABPROT  --  15.2  INR  --  1.21  CREATININE 0.69 0.40*  0.39*    Estimated Creatinine Clearance: 60.3 mL/min (A) (by C-G formula based on SCr of 0.39 mg/dL (L)).   Medical History: Past Medical History:  Diagnosis Date  . Chronic respiratory failure (HCC)   . COPD (chronic obstructive pulmonary disease) (HCC)   . Diabetes mellitus without complication (HCC)   . ESBL (extended spectrum beta-lactamase) producing bacteria infection   . MRSA (methicillin resistant Staphylococcus aureus)   . Seizures (HCC)   . Stroke (HCC)   . VRE (vancomycin-resistant Enterococci)     Medications:  See medication history  Assessment: 72 yo man to continue lovenox for cerebral venous sinus thrombosis. Goal of Therapy:  Anti-Xa level 0.6-1 units/ml 4hrs after LMWH dose given Monitor platelets by anticoagulation protocol: Yes   Plan:  Lovenox 50 mg sq q12 hours CBC q MWF while on lovenox Monitor for bleeding complications  Joe Sandoval 03/10/2018,6:59 AM

## 2018-03-10 NOTE — ED Notes (Signed)
Admitting provider at bedside.

## 2018-03-10 NOTE — Progress Notes (Signed)
Admitted earlier today with likely sepsis.  Workup is in progress.  Patient is a critically ill.  Patient is DNR.  Prognosis is guarded.  We will continue to manage expectantly.  Kindly see the H&P done earlier today.

## 2018-03-11 DIAGNOSIS — J189 Pneumonia, unspecified organism: Secondary | ICD-10-CM | POA: Diagnosis not present

## 2018-03-11 DIAGNOSIS — Z9911 Dependence on respirator [ventilator] status: Secondary | ICD-10-CM | POA: Diagnosis not present

## 2018-03-11 DIAGNOSIS — A419 Sepsis, unspecified organism: Secondary | ICD-10-CM | POA: Diagnosis not present

## 2018-03-11 LAB — URINE CULTURE: Culture: NO GROWTH

## 2018-03-11 LAB — CBC
HCT: 29.3 % — ABNORMAL LOW (ref 39.0–52.0)
Hemoglobin: 8.7 g/dL — ABNORMAL LOW (ref 13.0–17.0)
MCH: 28.2 pg (ref 26.0–34.0)
MCHC: 29.7 g/dL — ABNORMAL LOW (ref 30.0–36.0)
MCV: 95.1 fL (ref 78.0–100.0)
Platelets: 285 10*3/uL (ref 150–400)
RBC: 3.08 MIL/uL — ABNORMAL LOW (ref 4.22–5.81)
RDW: 17.2 % — ABNORMAL HIGH (ref 11.5–15.5)
WBC: 14.1 10*3/uL — ABNORMAL HIGH (ref 4.0–10.5)

## 2018-03-11 LAB — GLUCOSE, CAPILLARY
GLUCOSE-CAPILLARY: 81 mg/dL (ref 65–99)
GLUCOSE-CAPILLARY: 78 mg/dL (ref 65–99)
Glucose-Capillary: 71 mg/dL (ref 65–99)
Glucose-Capillary: 95 mg/dL (ref 65–99)
Glucose-Capillary: 89 mg/dL (ref 65–99)

## 2018-03-11 LAB — MAGNESIUM: Magnesium: 1.7 mg/dL (ref 1.7–2.4)

## 2018-03-11 LAB — PHOSPHORUS: Phosphorus: 2.6 mg/dL (ref 2.5–4.6)

## 2018-03-11 LAB — MRSA PCR SCREENING: MRSA by PCR: POSITIVE — AB

## 2018-03-11 MED ORDER — ORAL CARE MOUTH RINSE
15.0000 mL | OROMUCOSAL | Status: DC
  Administered 2018-03-11 – 2018-03-23 (×116): 15 mL via OROMUCOSAL

## 2018-03-11 MED ORDER — VITAL HIGH PROTEIN PO LIQD: 1000.0000 mL | ORAL | Status: DC

## 2018-03-11 MED ORDER — SODIUM CHLORIDE 0.9% FLUSH
10.0000 mL | INTRAVENOUS | Status: DC | PRN
  Administered 2018-03-17: 10 mL
  Filled 2018-03-11: qty 40

## 2018-03-11 MED ORDER — CHLORHEXIDINE GLUCONATE CLOTH 2 % EX PADS: 6.0000 | MEDICATED_PAD | Freq: Every day | CUTANEOUS | Status: DC

## 2018-03-11 MED ORDER — SODIUM CHLORIDE 0.9% FLUSH
10.0000 mL | Freq: Two times a day (BID) | INTRAVENOUS | Status: DC
  Administered 2018-03-11 (×2): 20 mL
  Administered 2018-03-12 – 2018-03-20 (×13): 10 mL
  Administered 2018-03-21: 20 mL
  Administered 2018-03-21 – 2018-03-23 (×4): 10 mL

## 2018-03-11 MED ORDER — LACTATED RINGERS IV SOLN
INTRAVENOUS | Status: DC
  Administered 2018-03-11 (×2): via INTRAVENOUS

## 2018-03-11 MED ORDER — DEXTROSE 50 % IV SOLN
0.5000 | Freq: Once | INTRAVENOUS | Status: AC
  Administered 2018-03-11: 25 mL via INTRAVENOUS

## 2018-03-11 MED ORDER — DEXTROSE 50 % IV SOLN
INTRAVENOUS | Status: AC
  Administered 2018-03-11: 25 mL via INTRAVENOUS
  Filled 2018-03-11: qty 50

## 2018-03-11 MED ORDER — ALTEPLASE 2 MG IJ SOLR
2.0000 mg | Freq: Once | INTRAMUSCULAR | Status: AC
Start: 2018-03-11 — End: 2018-03-11
  Administered 2018-03-11: 2 mg

## 2018-03-11 MED ORDER — ALTEPLASE 2 MG IJ SOLR
2.0000 mg | Freq: Once | INTRAMUSCULAR | Status: AC
  Administered 2018-03-11: 2 mg

## 2018-03-11 MED ORDER — FLUCONAZOLE 100MG IVPB
100.0000 mg | INTRAVENOUS | Status: DC
  Administered 2018-03-12: 100 mg via INTRAVENOUS
  Filled 2018-03-11: qty 50

## 2018-03-11 MED ORDER — CHLORHEXIDINE GLUCONATE CLOTH 2 % EX PADS
6.0000 | MEDICATED_PAD | Freq: Every day | CUTANEOUS | Status: DC
  Administered 2018-03-12 – 2018-03-22 (×13): 6 via TOPICAL

## 2018-03-11 MED ORDER — PIVOT 1.5 CAL PO LIQD
1000.0000 mL | ORAL | Status: DC
  Administered 2018-03-11 – 2018-03-21 (×10): 1000 mL
  Filled 2018-03-11 (×18): qty 1000

## 2018-03-11 MED ORDER — INSULIN ASPART 100 UNIT/ML ~~LOC~~ SOLN
0.0000 [IU] | SUBCUTANEOUS | Status: DC
  Administered 2018-03-12: 1 [IU] via SUBCUTANEOUS
  Administered 2018-03-12 (×2): 2 [IU] via SUBCUTANEOUS
  Administered 2018-03-12 – 2018-03-21 (×15): 1 [IU] via SUBCUTANEOUS

## 2018-03-11 MED ORDER — CHLORHEXIDINE GLUCONATE 0.12% ORAL RINSE (MEDLINE KIT)
15.0000 mL | Freq: Two times a day (BID) | OROMUCOSAL | Status: DC
  Administered 2018-03-11 – 2018-03-23 (×24): 15 mL via OROMUCOSAL

## 2018-03-11 MED ORDER — FLUCONAZOLE IN SODIUM CHLORIDE 200-0.9 MG/100ML-% IV SOLN
200.0000 mg | Freq: Once | INTRAVENOUS | Status: AC
  Administered 2018-03-11: 200 mg via INTRAVENOUS
  Filled 2018-03-11: qty 100

## 2018-03-11 MED ORDER — MUPIROCIN 2 % EX OINT
1.0000 "application " | TOPICAL_OINTMENT | Freq: Two times a day (BID) | CUTANEOUS | Status: AC
  Administered 2018-03-11 – 2018-03-15 (×10): 1 via NASAL
  Filled 2018-03-11 (×2): qty 22

## 2018-03-11 NOTE — Progress Notes (Addendum)
Name: Joe Sandoval MRN: [id number] DOB: [birth year]    ADMISSION DATE:  03/09/2018 CONSULTATION DATE:  03/09/2018  REFERRING MD :  Dr. Clarene Duke  CHIEF COMPLAINT:  Hypotension/ VDRF  HISTORY OF PRESENT ILLNESS:   HPI obtained from medical chart review as patient is trach/ VDRF, will not follow commands, and unable to participate in history.    72 year old male with past medical history of ventilator dependent respiratory failure s/p tracheostomy, G-tube dependent, COPD, CVA with residual left hemiplegia, DM, MDRO including EBSL, MRSA, VRE, and seizures who is a resident at [facility] with state gold DNR form present.  Patient who was sent for fever, respiratory distress including agonal breathing onset today.  Patient recently finished course of vancomycin and cefepime 5 days ago.    On arrival, patient was in no respiratory distress on MV and neurologically at baseline.  Concern for sepsis given fever, tachycardia, and borderline hypotension based on measurements taken on left lower leg.  CXR showing RLL inflitrate, UTI noted, WBC 22.6, K 5.2, sCr 0.69, lactate 2.15 -> 3.1. Cultures sent.  He was started on meropenum and linezolid and started on 30 ml/kg fluid resusitation with improvement in blood pressure.  At this time, patient does not meet ICU criteria and PCCM consulted for ventilator management.    SUBJECTIVE:  Appears comfortable   VITAL SIGNS: Temp:  [97.6 F (36.4 C)-98 F (36.7 C)] 97.8 F (36.6 C) (03/27 0747) Pulse Rate:  [97-121] 114 (03/27 0800) Resp:  [15-28] 22 (03/27 0800) BP: (98-134)/(53-72) 123/63 (03/27 0800) SpO2:  [95 %-100 %] 100 % (03/27 0800) FiO2 (%):  [40 %] 40 % (03/27 0759) Weight:  [126 lb 12.2 oz (57.5 kg)] 126 lb 12.2 oz (57.5 kg) (03/27 0500)  PHYSICAL EXAMINATION: General: This is a chronically ill tracheostomy dependent male currently awake but not interactive HEENT: His tongue protrudes, still with white coating over tongue this is improved some.   Pupils are equal and reactive he has a #6 Shiley XLT distal in place there is no air leak Neuro: Flaccid upper extremity, contracted, not interactive. Pulmonary: Crackles bases no accessory use appears comfortable on pressure support ventilation Abdomen: Soft nontender PEG tube excoriated around site Extremity: Right AKA warm dry generalized anasarca Cardiac: Regular rate and rhythm Skin.  Stage IV decubitus wounds bilateral glutes, hip, and sacrum.  Also left lateral foot wound and left lower extremity chronic venous stasis changes.     Recent Labs  Lab 03/09/18 1831 03/10/18 0408  NA 142 144  145  K 5.2* 3.9  3.9  CL 107 111  110  CO2 26 26  27   BUN 72* 39*  38*  CREATININE 0.69 0.40*  0.39*  GLUCOSE 187* 112*  111*   Recent Labs  Lab 03/09/18 1831 03/10/18 0408 03/11/18 0524  HGB 11.0* 9.0* 8.7*  HCT 36.1* 30.9* 29.3*  WBC 22.6* 16.4* 14.1*  PLT PLATELETS APPEAR INCREASED 392 285   Ct Abdomen Pelvis Wo Contrast  Result Date: 03/10/2018 CLINICAL DATA:  72 year old male with abdominal pain and distention. Fever. Concern for abscess. EXAM: CT ABDOMEN AND PELVIS WITHOUT CONTRAST TECHNIQUE: Multidetector CT imaging of the abdomen and pelvis was performed following the standard protocol without IV contrast. COMPARISON:  None. FINDINGS: Evaluation of this exam is limited in the absence of intravenous contrast. Lower chest: There are emphysematous changes of the lung bases with diffuse interstitial coarsening. There is mucous plugging of the bilateral lower lobe bronchi with areas of consolidative changes of  the lung bases concerning for pneumonia. Clinical correlation is recommended. There is no intra-abdominal free air or free fluid. Hepatobiliary: The liver is unremarkable. The gallbladder is unremarkable. Pancreas: Unremarkable. No pancreatic ductal dilatation or surrounding inflammatory changes. Spleen: Normal in size without focal abnormality. Adrenals/Urinary Tract: Minimal  left adrenal thickening. The right adrenal gland is unremarkable. The kidneys, and visualized ureters appear unremarkable. The urinary bladder is partially distended. A Foley catheter is noted within the bladder. There is apparent diffuse thickening of the bladder wall which may be partly related to underdistention. Cystitis is not excluded. Correlation with urinalysis recommended. Stomach/Bowel: There is a percutaneous gastrostomy with tip and balloon in the body of the stomach. Contrast injected via the catheter opacifies the stomach. No extraluminal spillage of contrast. There is no bowel obstruction or active inflammation. A left lower quadrant ileostomy is noted. There is postsurgical changes of partial colon resection. Vascular/Lymphatic: Advanced aortoiliac atherosclerotic disease. A vascular stent graft extends from the right common iliac artery to the right common femoral artery. Evaluation of the vasculature is limited in the absence of intravenous contrast. A 1.9 x 1.5 cm ovoid density along the right common femoral vessels (series 3, image 48) is not well characterized but may represent an enlarged lymph node, a vascular aneurysm, or a retroperitoneal soft tissue lesion/metastatic nodule/lymph node. A smaller nodular density noted more superiorly on series 3, image 40. Reproductive: The prostate and seminal vesicles are grossly unremarkable as visualized. Other: Midline vertical anterior abdominal wall incisional scar. Mild diffuse subcutaneous edema. Musculoskeletal: Osteopenia with degenerative changes of the spine. Multiple sclerotic lesions involving the spine with the largest involving L2-1 and L2 most consistent with metastatic disease. Clinical correlation is recommended. No acute fracture. Decubitus ulcer over the left greater trochanter and ischial tuberosities. No fluid collection or abscess. Old healed right femoral fracture and partially visualized right femoral fixation hardware. Heterotopic  ossification of the soft tissues about the left greater trochanter likely related to chronic infection. IMPRESSION: 1. Mucous impaction in the lower lobe bronchi with findings concerning for bilateral lower lobe pneumonia. Clinical correlation is recommended. 2. Postsurgical changes of the bowel. No evidence of bowel obstruction. No fluid collection or abscess. 3.  Aortic Atherosclerosis (ICD10-I70.0). 4. Right iliac artery endovascular stent graft. 5. Ovoid soft tissue densities along the spine to the right of the aorta concerning for lymphadenopathy. Clinical correlation is recommended. 6. Osseous metastatic disease. Electronically Signed   By: Elgie Collard M.D.   On: 03/10/2018 03:50   Dg Chest Port 1 View  Result Date: 03/10/2018 CLINICAL DATA:  72 year old male with shortness of breath. EXAM: PORTABLE CHEST 1 VIEW COMPARISON:  Chest radiograph dated 03/09/2018 FINDINGS: Tracheostomy remains above the carina. Right-sided PICC in similar positioning. There is hyperinflation of the lungs with right lung base atelectasis versus scarring. Pneumonia is not excluded. A small right pleural effusion may be present. There is diffuse interstitial coarsening and bronchitic changes. No pneumothorax. The cardiac silhouette is within normal limits. No acute osseous pathology. IMPRESSION: Right lung base atelectasis/scarring versus infiltrate. Probable small right pleural effusion. Electronically Signed   By: Elgie Collard M.D.   On: 03/10/2018 05:06   Dg Chest Port 1 View  Result Date: 03/09/2018 CLINICAL DATA:  Recent pneumonia, sepsis EXAM: PORTABLE CHEST 1 VIEW COMPARISON:  None. FINDINGS: Endotracheal tube 5.8 cm above the carina. Right PICC line tip in the SVC. Heart is normal size. Mild hyperinflation of the lungs. Patchy opacity at the right lung base. Left lung  clear. No effusions. No acute bony abnormality. IMPRESSION: Mild hyperinflation. Patchy atelectasis or infiltrate at the right lung base.  Electronically Signed   By: Charlett NoseKevin  Dover M.D.   On: 03/09/2018 18:44   SIGNIFICANT EVENTS  3/25 Admitted  STUDIES:  CXR 3/25 >> mild hyperinflation; patchy atelectasis or infiltrate at right lung base  CULTURES:  3/25 BC x2 >> 3/25 UC >> 3/25 trach aspirate >>  ANTIBIOTICS: pta Vanc and cefepime 3/25 linezolid >> 3/25 meropenem >>  Lines/drains: (all from outside facility) DL R PICC Foley Shiley 6 trach XLT distal Gtube Ostomy  PIV x 1   BRIEF PATIENT DESCRIPTION:  [age] yoM, w/PMH of DNR, VDRF/ trach/ MDRO, Gtube dependent with recent treatment for HCAP sent from [facility] with fever, agonal ?on MV with respiratory distress, presented to ER in no distress, at baseline mental status with soft BP (taken on lower extremity), tachycardic, lactate of 2.15-> 3.1 with concern for Sepsis found to have RLL PNA, UTI, started on 6530ml/kg fluid bolus with improvement in BP to baseline and on LUE.     ASSESSMENT / PLAN:  Chronic respiratory failure with tracheostomy and VDRF HCAP- hx of MDS, s/p course of vanc and cefepime at [facility] Oral thrush  COPD Portable chest x-ray personally reviewed demonstrates right lower lobe infiltrate otherwise support tubes in satisfactory position Currently appears comfortable on pressure support ventilation Plan/recommendation Continue day number 3 linezolid and meropenem Continue pressure support ventilation as tolerated Await tracheal aspirate narrow as available Nystatin orally Routine trach care Stress ulcer prophylaxis  I think he could return to LTAC setting/nursing home setting with ventilator dependence Remainder per primary team    Simonne MartinetPeter E Mahki Spikes ACNP-BC [facility] Pager # [phone] OR # [phone] if no answer

## 2018-03-11 NOTE — Progress Notes (Signed)
RT note-Patient on wean this AM PS +10/5, tolerating well, RN informed, continue to monitor.

## 2018-03-11 NOTE — Plan of Care (Signed)
Pt stable, weaning on vent throughout day, potentially being discharged back to [facility] in AM Problem: Education: Goal: Knowledge of General Education information will improve Outcome: Progressing   Problem: Health Behavior/Discharge Planning: Goal: Ability to manage health-related needs will improve Outcome: Progressing   Problem: Clinical Measurements: Goal: Ability to maintain clinical measurements within normal limits will improve Outcome: Progressing Goal: Will remain free from infection Outcome: Progressing Goal: Diagnostic test results will improve Outcome: Progressing Goal: Respiratory complications will improve Outcome: Progressing Goal: Cardiovascular complication will be avoided Outcome: Progressing

## 2018-03-11 NOTE — Progress Notes (Addendum)
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, G-tube dependent, COPD, CVA with residual left hemiplegia, diabetes mellitus type 2, MDRO including ESBL MRSA VRE Sent over for fever respiratory distress and agonal breathing after having finished a recent course of antibiotics vancomycin and cefepime 5 days previously [2/16 had BC + for S hemolyticus and sputum cult=Pseudomonas, and proteus mirabilis On admission borderline hypotensive given 1.5 L in the emergency room CXR right lower lobe infiltrate WBC 20 2K5.2 creatinine 0.6 lactic acid went from 2.1-3.1 critical care saw patient changed to meropenem and the Nasalide--- placed on contact--also noted sacral and gluteal decubiti with oral pharyngeal thrush as well  Assessment & Plan:   Assessment:  The primary encounter diagnosis was Sepsis, due to unspecified organism ([place]). Diagnoses of HCAP (healthcare-associated pneumonia), Urinary tract infection without hematuria, site unspecified, Ventilator dependent ([place]), and Pneumonia were also pertinent to this visit.  Severe sepsis-multiple sources.  CT abdomen pelvis performed shows no abscess intra-abdominal but confirms pneumonia-continue meropenem and Zyvox.  Urine cultures pending blood culture no growth so far from 3/25 MRSA is positive-continue IV fluid 75 cc/h-Swallowing-follow respiratory culture  with a right-sided pneumonia patient probably has some element of aspiration and may need goals of care to determine further management at [facility]--I tried to contact son For concern of oropharyngeal thrush we will start fluconazole IV until we can be assured no further leakage from the PEG As per wound nurse note 326 left ischial right atrial left trochanter  coccygeal left lateral foot and dorsum right hand Wound care consult has been requested and we appreciate their input--to my exam no overt oozing from wounds and low suspicion for osteo at this time  malfunctioning PEG-noted oozing around tube Flushed at bedside by RN and no leakage-feel safe to trial tube feeds and re-initiate-note allergy to Vital Dietician will see IF no leakage from peg will use feeds and resume oral meds  Ventilator dependent respiratory failure, G-tube dependent with diverting ostomy-defer management to critical care-we will repeat chest x-ray a.m. 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) 110  (!) 110  Resp: (!) 23 16    Temp:  97.8 F (36.6 C)   TempSrc:   Oral   SpO2: 100% 100%  100%  Weight: 57.5 kg (126 lb 12.2 oz)     Height:         Intake/Output Summary (Last 24 hours) at 03/11/2018 0809 Last data filed at 03/11/2018 0600 Gross per 24 hour  Intake 2472.5 ml  Output 2175 ml  Net 297.5 ml   Filed Weights   03/09/18 1812 03/09/18 2024 03/11/18 0500  Weight: 50.7 kg (111 lb 12.8 oz) 50.3 kg (111 lb) 57.5 kg (126 lb 12.2 oz)    Examination: On ventilator at 10/5 Thick white exudate on tongue, eomi ncat no pallor cta b without added sound s1 s 2no m/r/g cta b wounds examined at bedside with RN--none seem to have thick slough or odour  Neuro-moves extremities gcs ~10   Data Reviewed: I have personally reviewed following labs and imaging studies  CBC: Recent Labs  Lab 03/09/18 1831 03/10/18 0408 03/11/18 0524  WBC 22.6* 16.4* 14.1*  NEUTROABS 20.1* 13.6*  --   HGB 11.0* 9.0* 8.7*  HCT 36.1* 30.9* 29.3*  MCV 95.0 93.6 95.1  PLT PLATELETS APPEAR INCREASED 392 092   Basic Metabolic Panel: Recent Labs  Lab 03/09/18 1831 03/09/18 2347 03/10/18 0408  NA 142  --  144  145  K 5.2*  --  3.9  3.9  CL 107  --  111  110  CO2 26  --  26  27  GLUCOSE 187*  --  112*  111*  BUN 72*  --  39*  38*  CREATININE 0.69  --  0.40*  0.39*  CALCIUM 9.3  --  8.9  9.0  MG  --  2.0  --   PHOS  --  2.8 2.7   GFR: Estimated Creatinine Clearance: 68.9 mL/min (A) (by C-G formula based on SCr of 0.39 mg/dL (L)). Liver Function Tests: Recent Labs  Lab 03/09/18 1831 03/10/18 0408  AST 54* 21  ALT 36 23  ALKPHOS 179* 134*  BILITOT 1.1 0.3  PROT 9.8* 8.0  ALBUMIN 1.8* 1.6*  1.6*   No results for input(s): LIPASE, AMYLASE in the last 168 hours. No results for input(s): AMMONIA in the last 168 hours. Coagulation Profile: Recent Labs  Lab 03/10/18 0408  INR 1.21   Cardiac Enzymes: No results for input(s): CKTOTAL, CKMB, CKMBINDEX, TROPONINI in the last 168 hours. CBG: Recent Labs  Lab 03/10/18 1813 03/10/18 1946 03/10/18 2344 03/11/18 0339 03/11/18 0736  GLUCAP 110* 88 74 81 78   Urine  analysis:    Component Value Date/Time   COLORURINE AMBER (A) 03/09/2018 1831   APPEARANCEUR TURBID (A) 03/09/2018 1831   LABSPEC 1.021 03/09/2018 1831   PHURINE 5.0 03/09/2018 1831   GLUCOSEU NEGATIVE 03/09/2018 1831   HGBUR MODERATE (A) 03/09/2018 1831   BILIRUBINUR NEGATIVE 03/09/2018 1831   KETONESUR NEGATIVE 03/09/2018 1831   PROTEINUR 100 (A) 03/09/2018 1831   NITRITE NEGATIVE 03/09/2018 1831   LEUKOCYTESUR LARGE (A) 03/09/2018 1831     Radiology Studies: Reviewed images personally in health database    Scheduled Meds: . chlorhexidine gluconate (MEDLINE KIT)  15 mL Mouth Rinse BID  . Chlorhexidine Gluconate Cloth  6 each Topical Daily  . Chlorhexidine Gluconate Cloth  6 each Topical Q0600  . collagenase   Topical Daily  . enoxaparin (LOVENOX) injection  50 mg Subcutaneous Q12H  . insulin aspart  0-9 Units Subcutaneous Q4H  . ipratropium-albuterol  3 mL Nebulization Q6H  .  levothyroxine  25 mcg Intravenous QAC breakfast  . mouth rinse  15 mL Mouth Rinse QID  . mupirocin ointment  1 application Nasal BID  . nystatin  5 mL Oral QID  . pantoprazole (PROTONIX) IV  40 mg Intravenous Q24H  . phenytoin (DILANTIN) IV  100 mg Intravenous Q8H  . sodium chloride flush  10-40 mL Intracatheter Q12H   Continuous Infusions: . levETIRAcetam Stopped (03/11/18 0435)  . linezolid (ZYVOX) IV Stopped (03/11/18 0050)  . meropenem (MERREM) IV Stopped (03/11/18 0515)     LOS: 2 days    Time spent: [place], MD [facility]   If 7PM-7AM, please contact night-coverage [email or web address] Password [facility] 03/11/2018, 8:09 AM

## 2018-03-11 NOTE — Progress Notes (Addendum)
Initial Nutrition Assessment   DOCUMENTATION CODES:   Not applicable  INTERVENTION:   Pivot 1.5 at 1750mL/hr x24 hours This provides 1800kcal, 113g protein, and 1008mL free water. Meets 100-108% of pt's needs.  Recommend 250mL free water flushes TID to meet pt's fluid needs.  NUTRITION DIAGNOSIS:   Inadequate oral intake related to inability to eat, lethargy/confusion as evidenced by NPO status.  GOAL:   Patient will meet greater than or equal to 90% of their needs  MONITOR:   Weight trends, TF tolerance, Skin, I & O's  REASON FOR ASSESSMENT:   Ventilator, Consult Enteral/tube feeding initiation and management  ASSESSMENT:   [age] y. male with PMH of ventilator dependent respiratory failure s/p tracheostomy, G-tube dependent, right leg amputation, COPD, CVA with residual left hemiplegia, DM, MDRO including EBSL, MRSA, VRE, and seizures. Pt is a resident at [facility] sent for fever and respiratory distress.    Pt with sepsis, PNA, UTI, multiple wounds.  At time of visit TF being held for PEG site leak; Discussed pt with MD, acceptable to initiate TF. Requested continuation of pt's prior TF formula at [facility]. Per RN, [facility]  Discussed pt's prior TF regimen with [facility] RN via phone. Pt was receiving Pivot 1.5 in 250mL boluses 6x per day.  Pt also on MVI. Pt with allergy to iodine and shellfish. Pt is bedbound, arrived at [facility] with trach/ventilator dependency and PEG. Pt wt was 107lb on 02/09/18 and 111.8lb 02/20/18.  Pivot 1.5 at 250mL boluses 6x per day provides: 2250kcal and 141g protein.  MAP >65 Pt not on vasopressors  Observed minute ventilation of 9.9L/min. Tmax x24 hours 36.7C  Net positive 2L since admit 100mL colostomy output since admit 2L urine output since admit  Pt wt up 15lb since admit. Some of this could be fluid related. Also question accuracy of the scaled weights given pt with multiple wound dressings. Admission  wt used to estimate needs.  Pt likely malnourished however, based on current information available pt does not meet criteria for malnutrition at this time.  Muscle depletion likely due to prolonged bed bound status. Fat depletions difficult to identify because pt is edematous.   Medications: Insulin, levothyroxine, protonix, zyvox. Lactated ringers @ 3075ml/hr  Labs: BUN (H; 38), Cr (L;0.39), corrected Calcium (H;10.92), Albumin (L;1.6)  CBG (last 3)  Recent Labs    03/11/18 0339 03/11/18 0736 03/11/18 1114  GLUCAP 81 78 71    NUTRITION - FOCUSED PHYSICAL EXAM:    Most Recent Value  Orbital Region  Moderate depletion  Upper Arm Region  Mild depletion  Thoracic and Lumbar Region  Unable to assess  Buccal Region  Unable to assess  Temple Region  Severe depletion  Clavicle Bone Region  Severe depletion  Clavicle and Acromion Bone Region  Severe depletion  Scapular Bone Region  Severe depletion  Dorsal Hand  Unable to assess  Patellar Region  Severe depletion  Anterior Thigh Region  Severe depletion  Posterior Calf Region  Severe depletion  Edema (RD Assessment)  Moderate  Hair  Reviewed  Eyes  Unable to assess  Mouth  Reviewed  Skin  Reviewed  Nails  Reviewed      Diet Order:  Diet NPO time specified  EDUCATION NEEDS:   Not appropriate for education at this time  Skin:  Skin Assessment: Skin Integrity Issues: Skin Integrity Issues:: Stage II, Stage III, Unstageable, Other (Comment) Stage II: R hand Stage III: ischial tuberosity Unstageable: Left  hip, coccyx Other: healing/scabbing foot  Last BM:  colostomy; soft/hypoactive bowel sounds  Height:   Ht Readings from Last 1 Encounters:  03/09/18 6' (1.829 m)    Weight:   Filed Weights   03/09/18 1812 03/09/18 2024 03/11/18 0500  Weight: 111 lb 12.8 oz (50.7 kg) 111 lb (50.3 kg) 126 lb 12.2 oz (57.5 kg)    Ideal Body Weight:  65.9 kg  BMI:  Adjusted BMI for entire leg amputation  18.6kg/m2  Estimated Nutritional Needs:   Kcal:  1800-2000  Protein:  90-105g  Fluid:  >1.8L    Kymberley Raz, MS, Dietetic Intern Pager # [phone]

## 2018-03-11 NOTE — Progress Notes (Signed)
Pharmacy Antibiotic Note  Joe ArtisJames Sandoval is a 72 y.o. male admitted on 03/09/2018 from [facility] with abnormal ABG, hyperglycemia, tachycardia and agonal breathing.  Per RN at [facility], patient just completed a course of vancomycin and cefepime for PNA.  Patient is on meropenem and linezolid for PNA, UTI and decub ulcer given history of ESBL.  ID approved Zyvox due to history of VRE and MRSA. Now starting fluconazole for oral candidiasis. Of note, fluconazole can increase serum phenytoin concentrations     Plan: -Fluconazole 200 mg IV once, then fluconazole 100 mg daily. Recommend 7 day course  -Zyvox 600mg  IV Q12H -Merrem 1gm IV Q8H -Monitor renal fxn, clinical progress -Monitor serum phenytoin levels while on fluconazole therapy   Height: 6' (182.9 cm) Weight: 126 lb 12.2 oz (57.5 kg) IBW/kg (Calculated) : 77.6  Temp (24hrs), Avg:97.8 F (36.6 C), Min:97.6 F (36.4 C), Max:98 F (36.7 C)  Recent Labs  Lab 03/09/18 1831 03/09/18 1845 03/09/18 2025 03/10/18 0408 03/11/18 0524  WBC 22.6*  --   --  16.4* 14.1*  CREATININE 0.69  --   --  0.40*  0.39*  --   LATICACIDVEN  --  2.15* 3.10* 1.3  --     Estimated Creatinine Clearance: 68.9 mL/min (A) (by C-G formula based on SCr of 0.39 mg/dL (L)).    Allergies  Allergen Reactions  . Iodine Other (See Comments)    Unknown reaction (listed on [facility] paperwork)  . Shellfish Allergy Other (See Comments)    Unknown reaction (listed on [facility] Paperwork    Merrem 3/25 >> Zyvox 3/25 >> Fluconazole 3/27>>  3/25 BCx - ngtd  3/25 UCx - neg 3/26 MRSA PCR pos    Thank you for allowing pharmacy to be a part of this patient's care.  Vinnie LevelBenjamin Makiah Foye, PharmD., BCPS Clinical Pharmacist Clinical phone for 03/11/18 until 3:30pm: [phone] If after 3:30pm, please call main pharmacy at: [phone]

## 2018-03-12 ENCOUNTER — Inpatient Hospital Stay (HOSPITAL_COMMUNITY): Payer: Medicare Other

## 2018-03-12 DIAGNOSIS — A419 Sepsis, unspecified organism: Secondary | ICD-10-CM | POA: Diagnosis not present

## 2018-03-12 LAB — COMPREHENSIVE METABOLIC PANEL
ALT: 16 U/L — AB (ref 17–63)
AST: 23 U/L (ref 15–41)
Albumin: 1.2 g/dL — ABNORMAL LOW (ref 3.5–5.0)
Alkaline Phosphatase: 102 U/L (ref 38–126)
Anion gap: 7 (ref 5–15)
BILIRUBIN TOTAL: 0.3 mg/dL (ref 0.3–1.2)
BUN: 11 mg/dL (ref 6–20)
CHLORIDE: 115 mmol/L — AB (ref 101–111)
CO2: 26 mmol/L (ref 22–32)
CREATININE: 0.33 mg/dL — AB (ref 0.61–1.24)
Calcium: 8.3 mg/dL — ABNORMAL LOW (ref 8.9–10.3)
GFR calc Af Amer: >60 mL/min (ref >60)
Glucose, Bld: 131 mg/dL — ABNORMAL HIGH (ref 65–99)
Potassium: 3.3 mmol/L — ABNORMAL LOW (ref 3.5–5.1)
Sodium: 148 mmol/L — ABNORMAL HIGH (ref 135–145)
TOTAL PROTEIN: 6.4 g/dL — AB (ref 6.5–8.1)

## 2018-03-12 LAB — GLUCOSE, CAPILLARY
GLUCOSE-CAPILLARY: 153 mg/dL — AB (ref 65–99)
GLUCOSE-CAPILLARY: 102 mg/dL — AB (ref 65–99)
GLUCOSE-CAPILLARY: 120 mg/dL — AB (ref 65–99)
Glucose-Capillary: 121 mg/dL — ABNORMAL HIGH (ref 65–99)
Glucose-Capillary: 122 mg/dL — ABNORMAL HIGH (ref 65–99)
Glucose-Capillary: 145 mg/dL — ABNORMAL HIGH (ref 65–99)

## 2018-03-12 LAB — MAGNESIUM
MAGNESIUM: 1.6 mg/dL — AB (ref 1.7–2.4)
Magnesium: 2.4 mg/dL (ref 1.7–2.4)

## 2018-03-12 LAB — PHOSPHORUS
Phosphorus: 1.8 mg/dL — ABNORMAL LOW (ref 2.5–4.6)
Phosphorus: 3.1 mg/dL (ref 2.5–4.6)

## 2018-03-12 MED ORDER — LEVETIRACETAM 100 MG/ML PO SOLN: 1500.0000 mg | Freq: Two times a day (BID) | ORAL | Status: DC

## 2018-03-12 MED ORDER — METOCLOPRAMIDE HCL 5 MG/ML IJ SOLN
5.0000 mg | Freq: Three times a day (TID) | INTRAMUSCULAR | Status: DC
  Administered 2018-03-12 – 2018-03-18 (×18): 5 mg via INTRAVENOUS
  Filled 2018-03-12 (×15): qty 2

## 2018-03-12 MED ORDER — MAGNESIUM SULFATE 4 GM/100ML IV SOLN
4.0000 g | Freq: Once | INTRAVENOUS | Status: AC
  Administered 2018-03-12: 4 g via INTRAVENOUS
  Filled 2018-03-12: qty 100

## 2018-03-12 MED ORDER — LEVETIRACETAM 100 MG/ML PO SOLN
1500.0000 mg | Freq: Two times a day (BID) | ORAL | Status: DC
  Administered 2018-03-12 – 2018-03-23 (×23): 1500 mg
  Filled 2018-03-12 (×32): qty 15

## 2018-03-12 MED ORDER — QUETIAPINE FUMARATE 25 MG PO TABS
25.0000 mg | ORAL_TABLET | Freq: Two times a day (BID) | ORAL | Status: DC
  Administered 2018-03-12 – 2018-03-23 (×22): 25 mg
  Filled 2018-03-12 (×22): qty 1

## 2018-03-12 MED ORDER — LEVOTHYROXINE SODIUM 50 MCG PO TABS
50.0000 ug | ORAL_TABLET | Freq: Every day | ORAL | Status: DC
  Administered 2018-03-13 – 2018-03-23 (×11): 50 ug
  Filled 2018-03-12 (×11): qty 1

## 2018-03-12 MED ORDER — FREE WATER
200.0000 mL | Freq: Three times a day (TID) | Status: DC
  Administered 2018-03-12 – 2018-03-23 (×34): 200 mL

## 2018-03-12 MED ORDER — PHENYTOIN 125 MG/5ML PO SUSP
100.0000 mg | Freq: Three times a day (TID) | ORAL | Status: DC
  Administered 2018-03-12 – 2018-03-17 (×15): 100 mg
  Filled 2018-03-12 (×16): qty 4

## 2018-03-12 MED ORDER — PANTOPRAZOLE SODIUM 40 MG PO PACK
40.0000 mg | PACK | Freq: Every day | ORAL | Status: DC
  Administered 2018-03-12 – 2018-03-22 (×11): 40 mg
  Filled 2018-03-12 (×11): qty 20

## 2018-03-12 MED ORDER — FOLIC ACID 1 MG PO TABS
5.0000 mg | ORAL_TABLET | Freq: Every day | ORAL | Status: DC
  Administered 2018-03-12 – 2018-03-23 (×11): 5 mg via ORAL
  Filled 2018-03-12 (×12): qty 5

## 2018-03-12 MED ORDER — LORAZEPAM 1 MG PO TABS
2.0000 mg | ORAL_TABLET | ORAL | Status: DC | PRN
  Administered 2018-03-12 – 2018-03-23 (×4): 2 mg
  Filled 2018-03-12 (×4): qty 2

## 2018-03-12 MED ORDER — POTASSIUM CHLORIDE 20 MEQ/15ML (10%) PO SOLN
40.0000 meq | Freq: Once | ORAL | Status: AC
  Administered 2018-03-12: 40 meq
  Filled 2018-03-12: qty 30

## 2018-03-12 MED ORDER — ONDANSETRON HCL 4 MG PO TABS: 4.0000 mg | ORAL_TABLET | ORAL | Status: DC | PRN

## 2018-03-12 MED ORDER — METOPROLOL TARTRATE 50 MG PO TABS
50.0000 mg | ORAL_TABLET | Freq: Two times a day (BID) | ORAL | Status: DC
  Administered 2018-03-12 – 2018-03-23 (×22): 50 mg
  Filled 2018-03-12 (×23): qty 1

## 2018-03-12 MED ORDER — POTASSIUM PHOSPHATES 15 MMOLE/5ML IV SOLN
30.0000 mmol | Freq: Once | INTRAVENOUS | Status: AC
  Administered 2018-03-12: 30 mmol via INTRAVENOUS
  Filled 2018-03-12: qty 10

## 2018-03-12 NOTE — Care Management Note (Signed)
Case Management Note Donn PieriniKristi Jacub Waiters RN, BSN Unit 4E-Case Manager-- 72M coverage [phone]  Patient Details  Name: Joe ArtisJames Sandoval MRN: [id number] Date of Birth: [birth year]  Subjective/Objective:   Pt admitted with sepsis- hx of vent. Dependent resp. Failure s/p trach, g-tube.                  Action/Plan: PTA pt resides at [facility] vent SNF- CSW notified and will follow for transition back to [facility] when medically stable.   Expected Discharge Date:                  Expected Discharge Plan:  Skilled Nursing Facility  In-House Referral:  Clinical Social Work  Discharge planning Services  CM Consult  Post Acute Care Choice:    Choice offered to:     DME Arranged:    DME Agency:     HH Arranged:    HH Agency:     Status of Service:  In process, will continue to follow  If discussed at Long Length of Stay Meetings, dates discussed:    Discharge Disposition: Skilled facility  Additional Comments:  Darrold SpanWebster, Yarelly Kuba Hall, RN 03/12/2018, 10:58 AM

## 2018-03-12 NOTE — Progress Notes (Signed)
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, G-tube dependent, COPD, CVA with residual left hemiplegia, diabetes mellitus type 2, MDRO including ESBL MRSA VRE Sent over for fever respiratory distress and agonal breathing after having finished a recent course of antibiotics vancomycin and cefepime 5 days previously [2/16 had BC + for S hemolyticus and sputum cult=Pseudomonas, and proteus mirabilis] On admission borderline hypotensive given 1.5 L in the emergency room CXR right lower lobe infiltrate WBC 22 K5.2 creatinine 0.6 lactic acid went from 2.1-3.1 critical care saw patient changed to meropenem/ Linezolide-- placed on contact--also noted sacral and gluteal decubiti with oral pharyngeal thrush as well  Assessment & Plan:   Assessment:  The primary encounter diagnosis was Sepsis, due to unspecified organism ([place]). Diagnoses of HCAP (healthcare-associated pneumonia), Urinary tract infection without hematuria, site unspecified, Ventilator dependent ([place]), and Pneumonia were also pertinent to this visit.  Severe sepsis-multiple sources.  CT abdomen pelvis performed shows no abscess intra-abdominal but confirms pneumonia-continue meropenem and Zyvox.   Urine cultures NGTD  blood culture no growth so far from 3/25 MRSA screen +saline lock IV fluid 75 cc/h--follow respiratory culture WBC 22-->16-->14  has  Aspiration ?goals of care to determine further management at [facility] For concern of oropharyngeal change diflucan to Nystatin 500,000 qid 3.29 As per wound nurse note 3.26 left ischial right atrial left trochanter coccygeal left lateral foot and dorsum right hand Wound care consult appreciate their input labs am  malfunctioning PEG-noted oozing  around tube Flushed at bedside by RN 3/27and no leakage-feel safe to trial tube feeds and re-initiate-note allergy to Vital Adding free water flush 200 q8  Ventilator dependent respiratory failure, G-tube dependent with diverting ostomy-defer management to critical care-we will repeat chest x-ray a.m. 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-continue Keppra 1.5 every 12, Dilantin 100 every 8 note that patient is not on antiplatelet agent  Diabetes mellitus type 2-sliding scale coverage at this time-Sugars are 153-122 copntrol seems in OP to be complicated by hypoglycemia--is on glucagon as OP  Hypothyroidism-continue Synthroid dose po resuemd 3.29  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--currently 39/0.4--->resolving to 11/0.33 Phos and mag low on 3/28 am--recheck 3.29 as replaced by CCM protoocl  Venous thrombosis history-continue Lovenox full dose   DVT prophylaxis: lovenox  Code Status:   partial   Family Communication:   Called  Son on number listed in chart-no answer Disposition Plan: inpatient but could complete care at [facility] if continues to be stable   Consultants:   PCCM  Procedures:   cxr  Antimicrobials:   Merropenem and Zyvox   Subjective:  Awake much more than prior Motions in response but unable to verbalize or communicate     Objective: Vitals:   03/12/18 1128 03/12/18 1145 03/12/18 1200 03/12/18 1300  BP:   124/66 (!) 109/54  Pulse:  (!) 106 (!) 106 (!) 105  Resp:  (!) 21 (!) 29 20  Temp: 98.1 F (36.7 C)     TempSrc: Oral     SpO2:  100% 100% 100%  Weight:      Height:        Intake/Output Summary (Last 24 hours) at 03/12/2018  1402 Last data filed at 03/12/2018 1300 Gross per 24 hour  Intake 3712.91 ml  Output 1045 ml  Net 2667.91 ml   Filed Weights   03/09/18 2024 03/11/18 0500 03/12/18 0500  Weight: 50.3 kg (111 lb) 57.5  kg (126 lb 12.2 oz) 60.3 kg (132 lb 15 oz)    Examination: On ventilator at 10/5 Less white exudate on tongue, eomi ncat no pallor cta b without added sound, trach in place-clean s1 s 2no m/r/g cta b wounds examined at bedside with RN--none seem to have thick slough or odour--abd is a little firm in RLQ but no rebound no gaurd  Neuro-moves extremities gcs ~14   Data Reviewed: I have personally reviewed following labs and imaging studies  CBC: Recent Labs  Lab 03/09/18 1831 03/10/18 0408 03/11/18 0524  WBC 22.6* 16.4* 14.1*  NEUTROABS 20.1* 13.6*  --   HGB 11.0* 9.0* 8.7*  HCT 36.1* 30.9* 29.3*  MCV 95.0 93.6 95.1  PLT PLATELETS APPEAR INCREASED 392 102   Basic Metabolic Panel: Recent Labs  Lab 03/09/18 1831 03/09/18 2347 03/10/18 0408 03/11/18 1857 03/12/18 0432  NA 142  --  144  145  --  148*  K 5.2*  --  3.9  3.9  --  3.3*  CL 107  --  111  110  --  115*  CO2 26  --  26  27  --  26  GLUCOSE 187*  --  112*  111*  --  131*  BUN 72*  --  39*  38*  --  11  CREATININE 0.69  --  0.40*  0.39*  --  0.33*  CALCIUM 9.3  --  8.9  9.0  --  8.3*  MG  --  2.0  --  1.7 1.6*  PHOS  --  2.8 2.7 2.6 1.8*   GFR: Estimated Creatinine Clearance: 72.2 mL/min (A) (by C-G formula based on SCr of 0.33 mg/dL (L)). Liver Function Tests: Recent Labs  Lab 03/09/18 1831 03/10/18 0408 03/12/18 0432  AST 54* 21 23  ALT 36 23 16*  ALKPHOS 179* 134* 102  BILITOT 1.1 0.3 0.3  PROT 9.8* 8.0 6.4*  ALBUMIN 1.8* 1.6*  1.6* 1.2*   No results for input(s): LIPASE, AMYLASE in the last 168 hours. No results for input(s): AMMONIA in the last 168 hours. Coagulation Profile: Recent Labs  Lab 03/10/18 0408  INR 1.21   Cardiac Enzymes: No results for input(s): CKTOTAL, CKMB, CKMBINDEX, TROPONINI in the last 168 hours. CBG: Recent Labs  Lab 03/11/18 1935 03/12/18 0024 03/12/18 0420 03/12/18 0747 03/12/18 1125  GLUCAP 89 121* 122* 145* 153*   Urine analysis:    Component  Value Date/Time   COLORURINE AMBER (A) 03/09/2018 1831   APPEARANCEUR TURBID (A) 03/09/2018 1831   LABSPEC 1.021 03/09/2018 1831   PHURINE 5.0 03/09/2018 1831   GLUCOSEU NEGATIVE 03/09/2018 1831   HGBUR MODERATE (A) 03/09/2018 1831   BILIRUBINUR NEGATIVE 03/09/2018 1831   KETONESUR NEGATIVE 03/09/2018 1831   PROTEINUR 100 (A) 03/09/2018 1831   NITRITE NEGATIVE 03/09/2018 1831   LEUKOCYTESUR LARGE (A) 03/09/2018 1831     Radiology Studies: Reviewed images personally in health database    Scheduled Meds: . chlorhexidine gluconate (MEDLINE KIT)  15 mL Mouth Rinse BID  . Chlorhexidine Gluconate Cloth  6 each Topical Daily  . collagenase   Topical Daily  . enoxaparin (LOVENOX) injection  50 mg Subcutaneous Q12H  . insulin aspart  0-9 Units Subcutaneous Q4H  .  ipratropium-albuterol  3 mL Nebulization Q6H  . levothyroxine  25 mcg Intravenous QAC breakfast  . mouth rinse  15 mL Mouth Rinse 10 times per day  . mupirocin ointment  1 application Nasal BID  . nystatin  5 mL Oral QID  . pantoprazole (PROTONIX) IV  40 mg Intravenous Q24H  . phenytoin (DILANTIN) IV  100 mg Intravenous Q8H  . sodium chloride flush  10-40 mL Intracatheter Q12H   Continuous Infusions: . feeding supplement (PIVOT 1.5 CAL) 50 mL/hr at 03/12/18 1300  . fluconazole (DIFLUCAN) IV Stopped (03/12/18 1235)  . levETIRAcetam Stopped (03/12/18 0222)  . linezolid (ZYVOX) IV Stopped (03/12/18 1236)  . meropenem (MERREM) IV Stopped (03/12/18 1248)     LOS: 3 days    Time spent: [place], MD [facility]   If 7PM-7AM, please contact night-coverage [email or web address] Password [facility] 03/12/2018, 2:02 PM

## 2018-03-12 NOTE — Progress Notes (Signed)
CSW spoke with [facility] regarding pt possibly being stable to DC tomorrow.  [facility] does NOT anticipate having a bed for patient tomorrow but if one opens up CSW confirmed they could take patient back on IV meropenum and IV Zyvox- [facility] to follow up with CSW tomorrow if bed becomes available.  CSW will continue to follow and assist with return to [facility] when stable and bed available  Burna SisJenna H. Marbin Olshefski, LCSW Clinical Social Worker [phone]

## 2018-03-12 NOTE — Progress Notes (Signed)
eLink Physician-Brief Progress Note Patient Name: Deanna ArtisJames Ozier DOB: 1946-02-26 MRN: [id number]   Date of Service  03/12/2018  HPI/Events of Note  Hypokalemia, hypophos, hypomag  eICU Interventions  Potassium, Phos and Mag replaced     Intervention Category Intermediate Interventions: Electrolyte abnormality - evaluation and management  DETERDING,ELIZABETH 03/12/2018, 6:26 AM

## 2018-03-12 NOTE — Progress Notes (Addendum)
Nutrition Follow-up  DOCUMENTATION CODES:   Not applicable  INTERVENTION:   Pt is showing signs of refeeding syndrome, continue to monitor K, Phos, Mg.  Recommend 250mL free water flushes TID to meet pt's fluid needs.  Continue Pivot 1.5 at 7150mL/hr x24 hours This provides 1800kcal, 113g protein, and 1008mL free water. Meets 100-108% of pt's needs.  NUTRITION DIAGNOSIS:   Inadequate oral intake related to inability to eat, lethargy/confusion as evidenced by NPO status. Ongoing.  GOAL:   Patient will meet greater than or equal to 90% of their needs Meeting with TF.  MONITOR:   Weight trends, TF tolerance, Skin, I & O's  REASON FOR ASSESSMENT:   Ventilator, Consult Enteral/tube feeding initiation and management  ASSESSMENT:   [age] y. male with PMH of ventilator dependent respiratory failure s/p tracheostomy, G-tube dependent, right leg amputation, COPD, CVA with residual left hemiplegia, DM, MDRO including EBSL, MRSA, VRE, and seizures. Pt is a resident at [facility] sent for fever and respiratory distress.   Pt with sepsis, PNA, UTI, multiple wounds.  Per RN, pt tolerating TF well.   Pt is showing signs of refeeding, as K, Phos, Mg, are now low since the initiation of TF. Electrolyte repletion being addressed.  Discussed addition of free water flushes with RN; will be brought up at MD rounds if pt has not discharged by then.  Net positive 4.8L since admit 105mL colostomy output x24 hours 725mL urine output x24hours  Pt wt up 21lb since admit. Some of this could be fluid related as well as multiple wound dressings. Admission wt was used to estimate needs.  Pt likely malnourished however, based on current information available pt does not meet criteria for malnutrition at this time.   Medications: Insulin, levothyroxine, protonix, KCl x1, zyvox, Mg sulfate, potassium phosphate in D5.  Lactated ringers d/c this morning.  Labs: BUN (WNL), Cr (L;0.33),  Phosphorus  (L;1.8), Mg (L;1.6), K (L;3.3), Na (H;148) corrected Calcium (H;10.54), Albumin (L;1.2)  CBG (last 3)  Recent Labs    03/12/18 0024 03/12/18 0420 03/12/18 0747  GLUCAP 121* 122* 145*     Diet Order:  Diet NPO time specified  EDUCATION NEEDS:   Not appropriate for education at this time  Skin:  Skin Assessment: Skin Integrity Issues: Skin Integrity Issues:: Stage II, Stage III, Unstageable, Other (Comment) Stage II: R hand Stage III: ischial tuberosity Unstageable: Left hip, coccyx Other: healing/scabbing foot  Last BM:  100mL colostomy; soft/hypoactive bowel sounds  Height:   Ht Readings from Last 1 Encounters:  03/09/18 6' (1.829 m)    Weight:   Wt Readings from Last 1 Encounters:  03/12/18 132 lb 15 oz (60.3 kg)  3/25  111lb 12.8oz  Ideal Body Weight:  65.9 kg  BMI:  Body mass index is 18.03 kg/m.  Estimated Nutritional Needs:   Kcal:  1800-2000  Protein:  90-105g  Fluid:  >1.8L   Miyuki Rzasa, MS, Dietetic Intern Pager # [phone]

## 2018-03-12 NOTE — Plan of Care (Signed)
  Problem: Education: Goal: Knowledge of General Education information will improve Outcome: Progressing   Problem: Health Behavior/Discharge Planning: Goal: Ability to manage health-related needs will improve Outcome: Progressing   Problem: Clinical Measurements: Goal: Ability to maintain clinical measurements within normal limits will improve Outcome: Progressing Goal: Will remain free from infection Outcome: Progressing Goal: Diagnostic test results will improve Outcome: Progressing Goal: Respiratory complications will improve Outcome: Progressing Goal: Cardiovascular complication will be avoided Outcome: Progressing   Problem: Activity: Goal: Risk for activity intolerance will decrease Outcome: Progressing   Problem: Nutrition: Goal: Adequate nutrition will be maintained Outcome: Progressing Note:  Tube feedings restarted   Problem: Coping: Goal: Level of anxiety will decrease Outcome: Progressing   Problem: Elimination: Goal: Will not experience complications related to bowel motility Outcome: Progressing Note:  Colostomy in place Goal: Will not experience complications related to urinary retention Outcome: Progressing Note:  Foley in place    Problem: Pain Managment: Goal: General experience of comfort will improve Outcome: Progressing   Problem: Safety: Goal: Ability to remain free from injury will improve Outcome: Progressing   Problem: Skin Integrity: Goal: Risk for impaired skin integrity will decrease Outcome: Progressing Note:  Dressing changes to pressure ulcers daily, q2 hr turning, rotation/low air loss bed, float heel, prevalon boot left leg

## 2018-03-13 ENCOUNTER — Other Ambulatory Visit: Payer: Self-pay

## 2018-03-13 ENCOUNTER — Encounter (HOSPITAL_COMMUNITY): Payer: Self-pay

## 2018-03-13 DIAGNOSIS — J189 Pneumonia, unspecified organism: Secondary | ICD-10-CM | POA: Diagnosis not present

## 2018-03-13 DIAGNOSIS — A419 Sepsis, unspecified organism: Secondary | ICD-10-CM | POA: Diagnosis not present

## 2018-03-13 DIAGNOSIS — Z9911 Dependence on respirator [ventilator] status: Secondary | ICD-10-CM | POA: Diagnosis not present

## 2018-03-13 LAB — RENAL FUNCTION PANEL
Albumin: 1.3 g/dL — ABNORMAL LOW (ref 3.5–5.0)
Anion gap: 8 (ref 5–15)
BUN: 9 mg/dL (ref 6–20)
CALCIUM: 8.1 mg/dL — AB (ref 8.9–10.3)
CHLORIDE: 111 mmol/L (ref 101–111)
CO2: 24 mmol/L (ref 22–32)
Creatinine, Ser: 0.34 mg/dL — ABNORMAL LOW (ref 0.61–1.24)
GFR calc non Af Amer: >60 mL/min (ref >60)
Glucose, Bld: 104 mg/dL — ABNORMAL HIGH (ref 65–99)
Phosphorus: 2.3 mg/dL — ABNORMAL LOW (ref 2.5–4.6)
Potassium: 4.1 mmol/L (ref 3.5–5.1)
Sodium: 143 mmol/L (ref 135–145)

## 2018-03-13 LAB — CBC WITH DIFFERENTIAL/PLATELET
BASOS ABS: 0 10*3/uL (ref 0.0–0.1)
BASOS PCT: 0 %
Eosinophils Absolute: 0.7 10*3/uL (ref 0.0–0.7)
Eosinophils Relative: 9 %
HCT: 26.4 % — ABNORMAL LOW (ref 39.0–52.0)
Hemoglobin: 7.7 g/dL — ABNORMAL LOW (ref 13.0–17.0)
LYMPHS ABS: 1.5 10*3/uL (ref 0.7–4.0)
Lymphocytes Relative: 19 %
MCH: 27.5 pg (ref 26.0–34.0)
MCHC: 29.2 g/dL — ABNORMAL LOW (ref 30.0–36.0)
MCV: 94.3 fL (ref 78.0–100.0)
MONOS PCT: 10 %
Monocytes Absolute: 0.8 10*3/uL (ref 0.1–1.0)
Neutro Abs: 4.8 10*3/uL (ref 1.7–7.7)
Neutrophils Relative %: 62 %
Platelets: 323 10*3/uL (ref 150–400)
RBC: 2.8 MIL/uL — ABNORMAL LOW (ref 4.22–5.81)
RDW: 17 % — AB (ref 11.5–15.5)
WBC: 7.8 10*3/uL (ref 4.0–10.5)

## 2018-03-13 LAB — GLUCOSE, CAPILLARY
GLUCOSE-CAPILLARY: 77 mg/dL (ref 65–99)
GLUCOSE-CAPILLARY: 78 mg/dL (ref 65–99)
GLUCOSE-CAPILLARY: 96 mg/dL (ref 65–99)
Glucose-Capillary: 97 mg/dL (ref 65–99)
Glucose-Capillary: 94 mg/dL (ref 65–99)
Glucose-Capillary: 100 mg/dL — ABNORMAL HIGH (ref 65–99)

## 2018-03-13 LAB — MAGNESIUM: MAGNESIUM: 2.1 mg/dL (ref 1.7–2.4)

## 2018-03-13 NOTE — NC FL2 (Signed)
[place] LEVEL OF CARE SCREENING TOOL     IDENTIFICATION  Patient Name: Joe Sandoval Birthdate: [birth year] Sex: male Admission Date (Current Location): 03/09/2018  [facility] and [place] Number:  Herbalist and Address:  [facility], [place] [address], [place],  [address]      Provider Number: [id number]  Attending Physician Name and Address:  Nita Sells, MD  Relative Name and Phone Number:  Marinus Maw    [phone]     Current Level of Care: Hospital Recommended Level of Care: Vent SNF Prior Approval Number:    Date Approved/Denied:   PASRR Number: [id number]  Discharge Plan: SNF    Current Diagnoses: Patient Active Problem List   Diagnosis Date Noted  . Seizure ([place]) 03/10/2018  . Leaking PEG tube ([place]) 03/10/2018  . Decubitus ulcer 03/10/2018  . Acute respiratory failure with hypoxia ([place]) 03/09/2018  . Sepsis ([place])   . HCAP (healthcare-associated pneumonia)   . Ventilator dependent (HCC)     Orientation RESPIRATION BLADDER Height & Weight     (Intubated)  (Vent 40%, 2m shiley cuffed trach) Continent, Indwelling catheter Weight: 59.4 kg (130 lb 15.3 oz) Height:  6' (182.9 cm)  BEHAVIORAL SYMPTOMS/MOOD NEUROLOGICAL BOWEL NUTRITION STATUS      Continent, Colostomy(Gastrostomy) Feeding tube  AMBULATORY STATUS COMMUNICATION OF NEEDS Skin   Total Care (Intubated) PU Stage and Appropriate Care(Stage II wound on hand; unstageable on hip and coccyx;Stage III on ischial tuberosity, on an air mattress)                       Personal Care Assistance Level of Assistance  Bathing, Feeding, Dressing Bathing Assistance: Maximum assistance Feeding assistance: Maximum assistance Dressing Assistance: Maximum assistance     Functional Limitations Info  Speech     Speech Info: Impaired    SPECIAL CARE FACTORS FREQUENCY  (On a vent)                    Contractures      Additional Factors Info   Code Status, Allergies, Isolation Precautions Code Status Info: Partial Allergies Info: Iodine, Shellfish Allergy     Isolation Precautions Info: MRSA, droplet precautions     Current Medications (03/13/2018):  This is the current hospital active medication list Current Facility-Administered Medications  Medication Dose Route Frequency Provider Last Rate Last Dose  . acetaminophen (TYLENOL) tablet 650 mg  650 mg Oral Q6H PRN KRise Patience MD   650 mg at 03/13/18 0155   Or  . acetaminophen (TYLENOL) suppository 650 mg  650 mg Rectal Q6H PRN KRise Patience MD   650 mg at 03/12/18 1703  . albuterol (PROVENTIL) (2.5 MG/3ML) 0.083% nebulizer solution 2.5 mg  2.5 mg Nebulization Q2H PRN KRise Patience MD      . chlorhexidine gluconate (MEDLINE KIT) (PERIDEX) 0.12 % solution 15 mL  15 mL Mouth Rinse BID SNita Sells MD   15 mL at 03/13/18 0738  . Chlorhexidine Gluconate Cloth 2 % PADS 6 each  6 each Topical Daily KRise Patience MD   6 each at 03/12/18 1600  . collagenase (SANTYL) ointment   Topical Daily ODana AllanI, MD      . enoxaparin (LOVENOX) injection 50 mg  50 mg Subcutaneous Q12H ODana AllanI, MD   50 mg at 03/13/18 0606  . feeding supplement (PIVOT 1.5 CAL) liquid 1,000 mL  1,000 mL Per Tube Continuous Samtani,  Jai-Gurmukh, MD 50 mL/hr at 03/13/18 0736    . folic acid (FOLVITE) tablet 5 mg  5 mg Oral Daily Nita Sells, MD   5 mg at 03/12/18 1500  . free water 200 mL  200 mL Per Tube Q8H Nita Sells, MD   200 mL at 03/13/18 0512  . insulin aspart (novoLOG) injection 0-9 Units  0-9 Units Subcutaneous Q4H Nita Sells, MD   2 Units at 03/12/18 1138  . ipratropium-albuterol (DUONEB) 0.5-2.5 (3) MG/3ML nebulizer solution 3 mL  3 mL Nebulization Q6H Rise Patience, MD   3 mL at 03/13/18 0402  . levETIRAcetam (KEPPRA) 100 MG/ML solution 1,500 mg  1,500 mg Per Tube Q12H Nita Sells, MD   1,500 mg at  03/13/18 0202  . levothyroxine (SYNTHROID, LEVOTHROID) tablet 50 mcg  50 mcg Per Tube QAC breakfast Nita Sells, MD   50 mcg at 03/13/18 0733  . linezolid (ZYVOX) IVPB 600 mg  600 mg Intravenous Q12H Rise Patience, MD   Stopped at 03/12/18 2330  . LORazepam (ATIVAN) tablet 2 mg  2 mg Per Tube Q4H PRN Nita Sells, MD   2 mg at 03/12/18 1642  . MEDLINE mouth rinse  15 mL Mouth Rinse 10 times per day Nita Sells, MD   15 mL at 03/13/18 0512  . meropenem (MERREM) 1 g in sodium chloride 0.9 % 100 mL IVPB  1 g Intravenous Q8H Rise Patience, MD   Stopped at 03/13/18 0404  . metoCLOPramide (REGLAN) injection 5 mg  5 mg Intravenous Q8H Nita Sells, MD   5 mg at 03/13/18 0512  . metoprolol tartrate (LOPRESSOR) tablet 50 mg  50 mg Per Tube BID Nita Sells, MD   50 mg at 03/12/18 2231  . mupirocin ointment (BACTROBAN) 2 % 1 application  1 application Nasal BID Rise Patience, MD   1 application at 32/91/91 2232  . nystatin (MYCOSTATIN) 100000 UNIT/ML suspension 500,000 Units  5 mL Oral QID Rise Patience, MD   500,000 Units at 03/12/18 2233  . ondansetron (ZOFRAN) tablet 4 mg  4 mg Oral Q6H PRN Rise Patience, MD      . pantoprazole sodium (PROTONIX) 40 mg/20 mL oral suspension 40 mg  40 mg Per Tube QHS Nita Sells, MD   40 mg at 03/12/18 2230  . phenytoin (DILANTIN) 125 MG/5ML suspension 100 mg  100 mg Per Tube Q8H Nita Sells, MD   100 mg at 03/13/18 0518  . QUEtiapine (SEROQUEL) tablet 25 mg  25 mg Per Tube BID Nita Sells, MD   25 mg at 03/12/18 2231  . sodium chloride flush (NS) 0.9 % injection 10-40 mL  10-40 mL Intracatheter Q12H Rise Patience, MD   10 mL at 03/12/18 2231  . sodium chloride flush (NS) 0.9 % injection 10-40 mL  10-40 mL Intracatheter PRN Rise Patience, MD         Discharge Medications: Please see discharge summary for a list of discharge medications.  Relevant Imaging  Results:  Relevant Lab Results:   Additional Information SSN: [id number]  Lissa Morales [place], [place]

## 2018-03-13 NOTE — Progress Notes (Signed)
ANTICOAGULATION CONSULT NOTE - Follow Up Consult  Pharmacy Consult for lovenox Indication: DVT  Allergies  Allergen Reactions  . Iodine Other (See Comments)    Unknown reaction (listed on [facility] paperwork)  . Shellfish Allergy Other (See Comments)    Unknown reaction (listed on [facility] Paperwork    Patient Measurements: Height: 6' (182.9 cm) Weight: 130 lb 15.3 oz (59.4 kg) IBW/kg (Calculated) : 77.6   Vital Signs: Temp: 98.8 F (37.1 C) (03/29 0745) Temp Source: Oral (03/29 0745) BP: 106/52 (03/29 0900) Pulse Rate: 104 (03/29 0900)  Labs: Recent Labs    03/11/18 0524 03/12/18 0432 03/13/18 0324  HGB 8.7*  --  7.7*  HCT 29.3*  --  26.4*  PLT 285  --  323  CREATININE  --  0.33* 0.34*    Estimated Creatinine Clearance: 71.2 mL/min (A) (by C-G formula based on SCr of 0.34 mg/dL (L)).   Medical History: Past Medical History:  Diagnosis Date  . Chronic respiratory failure (HCC)   . COPD (chronic obstructive pulmonary disease) (HCC)   . Diabetes mellitus without complication (HCC)   . ESBL (extended spectrum beta-lactamase) producing bacteria infection   . MRSA (methicillin resistant Staphylococcus aureus)   . Seizures (HCC)   . Stroke (HCC)   . VRE (vancomycin-resistant Enterococci)     Medications:  See medication history  Assessment: 72 yo man remains on pta lovenox for cerebral venous sinus thrombosis. H/H trending down. Plt wnl. SCr down to 0.34. Patient's weight has trended up slightly since admission but this is likely due to volume overload.   Goal of Therapy:  Anti-Xa level 0.6-1 units/ml 4hrs after LMWH dose given Monitor platelets by anticoagulation protocol: Yes   Plan:  Continue Lovenox 50 mg sq q12 hours CBC q MWF while on lovenox Monitor for bleeding complications  Vinnie LevelBenjamin Marinna Blane, PharmD., BCPS Clinical Pharmacist Clinical phone for 03/13/18 until 3:30pm: [phone] If after 3:30pm, please call main pharmacy at: [phone]

## 2018-03-13 NOTE — Progress Notes (Signed)
Name: Joe Sandoval MRN: [id number] DOB: 1946/06/05    ADMISSION DATE:  03/09/2018 CONSULTATION DATE:  03/09/2018  REFERRING MD :  Dr. Clarene Duke  CHIEF COMPLAINT:  Hypotension/ VDRF  HISTORY OF PRESENT ILLNESS:   HPI obtained from medical chart review as patient is trach/ VDRF, will not follow commands, and unable to participate in history.    72 year old male with past medical history of ventilator dependent respiratory failure s/p tracheostomy, G-tube dependent, COPD, CVA with residual left hemiplegia, DM, MDRO including EBSL, MRSA, VRE, and seizures who is a resident at [facility] with state gold DNR form present.  Patient who was sent for fever, respiratory distress including agonal breathing onset today.  Patient recently finished course of vancomycin and cefepime 5 days ago.    On arrival, patient was in no respiratory distress on MV and neurologically at baseline.  Concern for sepsis given fever, tachycardia, and borderline hypotension based on measurements taken on left lower leg.  CXR showing RLL inflitrate, UTI noted, WBC 22.6, K 5.2, sCr 0.69, lactate 2.15 -> 3.1. Cultures sent.  He was started on meropenum and linezolid and started on 30 ml/kg fluid resusitation with improvement in blood pressure.  At this time, patient does not meet ICU criteria and PCCM consulted for ventilator management.    SUBJECTIVE:  No significant changes VITAL SIGNS: Temp:  [97.7 F (36.5 C)-99.1 F (37.3 C)] 98.8 F (37.1 C) (03/29 0745) Pulse Rate:  [56-111] 104 (03/29 0900) Resp:  [15-34] 22 (03/29 0900) BP: (87-124)/(44-68) 106/52 (03/29 0900) SpO2:  [99 %-100 %] 100 % (03/29 0900) FiO2 (%):  [40 %] 40 % (03/29 0846) Weight:  [130 lb 15.3 oz (59.4 kg)] 130 lb 15.3 oz (59.4 kg) (03/29 0416)  PHYSICAL EXAMINATION: General: This is a 72 year old male patient currently on full ventilator support HEENT: Normocephalic atraumatic his tongue does protrude, the white coating previously on his tongue is  improved. Pulmonary: Scattered rhonchi equal chest rise no accessory use on full ventilator support Cardiac: Regular rate and rhythm Abdomen: Soft nontender Extremities: Contracted, right AKA. Warm and dry  Skin stage IV decub to bilateral glutes, hip and sacrum Neuro: Awake but noninteractive   Recent Labs  Lab 03/10/18 0408 03/12/18 0432 03/13/18 0324  NA 144  145 148* 143  K 3.9  3.9 3.3* 4.1  CL 111  110 115* 111  CO2 26  27 26 24   BUN 39*  38* 11 9  CREATININE 0.40*  0.39* 0.33* 0.34*  GLUCOSE 112*  111* 131* 104*   Recent Labs  Lab 03/10/18 0408 03/11/18 0524 03/13/18 0324  HGB 9.0* 8.7* 7.7*  HCT 30.9* 29.3* 26.4*  WBC 16.4* 14.1* 7.8  PLT 392 285 323   Dg Chest Port 1 View  Result Date: 03/12/2018 CLINICAL DATA:  Pneumonia. EXAM: PORTABLE CHEST 1 VIEW COMPARISON:  Radiograph of March 10, 2018. FINDINGS: Stable cardiomediastinal silhouette and central pulmonary vascular congestion. Tracheostomy tube is unchanged in position. No pneumothorax is noted. Stable bilateral interstitial lung densities are noted concerning for scarring or possibly edema. Mild bilateral pleural effusions are noted. Bony thorax is unremarkable. Right-sided PICC line is noted with distal tip in expected position of the SVC. IMPRESSION: Stable bilateral diffuse interstitial lung densities are noted which may represent scarring or edema. Mild bilateral pleural effusions are noted. Electronically Signed   By: Lupita Raider, M.D.   On: 03/12/2018 15:16   SIGNIFICANT EVENTS  3/25 Admitted  STUDIES:  CXR 3/25 >> mild  hyperinflation; patchy atelectasis or infiltrate at right lung base  CULTURES:  3/25 BC x2 >> 3/25 UC >>neg 3/25 trach aspirate >>  ANTIBIOTICS: pta Vanc and cefepime 3/25 linezolid >> 3/25 meropenem >>  Lines/drains: (all from outside facility) DL R PICC Foley Shiley 6 trach XLT distal Gtube Ostomy  PIV x 1   BRIEF PATIENT DESCRIPTION:  [age] yoM, w/PMH of DNR,  VDRF/ trach/ MDRO, Gtube dependent with recent treatment for HCAP sent from [facility] with fever, agonal ?on MV with respiratory distress, presented to ER in no distress, at baseline mental status with soft BP (taken on lower extremity), tachycardic, lactate of 2.15-> 3.1 with concern for Sepsis found to have RLL PNA, UTI, started on 5830ml/kg fluid bolus with improvement in BP to baseline and on LUE.    No significant changes ready to go back to [facility]  ASSESSMENT / PLAN:  Chronic respiratory failure with tracheostomy and VDRF HCAP- hx of MDS, s/p course of vanc and cefepime at [facility] Oral thrush  COPD -Portable chest x-ray reviewed from 3/28: Tracheostomy is in satisfactory position he has bilateral airspace disease.  Aeration looks a little bit worse in comparison to initial film -Culture data negative to date Plan/recommendation Continue full ventilator support Lasix x1 Day #5 of linezolid and meropenem; in light of negative culture data I think we can discontinue therapy at day #7 He can go back to skilled nursing facility on full ventilator support when bed is ready I do not anticipate much in the way of improvement    Simonne MartinetPeter E Sydnee Lamour ACNP-BC [facility] Pulmonary/Critical Care Pager # [phone] OR # [phone] if no answer

## 2018-03-13 NOTE — Progress Notes (Signed)
03/13/17  1400 hrs: Per D.r Simtani's orders, and under his supervision, [facility] 40% FiO2, at 10/5. RT notified.

## 2018-03-13 NOTE — Plan of Care (Deleted)
  Problem: Education: Goal: Knowledge of General Education information will improve 03/13/2018 2055 by Della Gooummings, Williard Keller R, RN Outcome: Not Progressing Note:  Intubated/sedated  03/13/2018 0719 by Della Gooummings, Tangi Shroff R, RN Outcome: Progressing   Problem: Health Behavior/Discharge Planning: Goal: Ability to manage health-related needs will improve 03/13/2018 2055 by Della Gooummings, Rashonda Warrior R, RN Outcome: Not Progressing Note:  Intubated/sedated  03/13/2018 0719 by Della Gooummings, Dontavion Noxon R, RN Outcome: Progressing   Problem: Clinical Measurements: Goal: Ability to maintain clinical measurements within normal limits will improve 03/13/2018 2055 by Della Gooummings, Linzy Laury R, RN Outcome: Progressing 03/13/2018 0719 by Della Gooummings, Destini Cambre R, RN Outcome: Progressing Goal: Will remain free from infection 03/13/2018 2055 by Della Gooummings, Manolito Jurewicz R, RN Outcome: Progressing 03/13/2018 0719 by Della Gooummings, Dyllen Menning R, RN Outcome: Progressing Goal: Diagnostic test results will improve 03/13/2018 2055 by Della Gooummings, Corri Delapaz R, RN Outcome: Progressing 03/13/2018 0719 by Della Gooummings, Tynia Wiers R, RN Outcome: Progressing Goal: Respiratory complications will improve 03/13/2018 2055 by Della Gooummings, Robina Hamor R, RN Outcome: Progressing Note:  Intubated 03/13/2018 0719 by Della Gooummings, Mashawn Brazil R, RN Outcome: Progressing Goal: Cardiovascular complication will be avoided 03/13/2018 2055 by Della Gooummings, Juletta Berhe R, RN Outcome: Progressing 03/13/2018 0719 by Della Gooummings, Rivky Clendenning R, RN Outcome: Progressing   Problem: Activity: Goal: Risk for activity intolerance will decrease 03/13/2018 2055 by Della Gooummings, Reyhan Moronta R, RN Outcome: Progressing 03/13/2018 0719 by Della Gooummings, Xeng Kucher R, RN Outcome: Progressing   Problem: Nutrition: Goal: Adequate nutrition will be maintained 03/13/2018 2055 by Della Gooummings, Severiano Utsey R, RN Outcome: Progressing Note:  Tube feeds started today 03/13/2018 0719 by Della Gooummings, Kamyiah Colantonio R, RN Outcome: Progressing   Problem:  Coping: Goal: Level of anxiety will decrease 03/13/2018 2055 by Della Gooummings, Ivianna Notch R, RN Outcome: Progressing Note:  Sedated on precedex, PRN fentanyl 03/13/2018 0719 by Della Gooummings, Ikesha Siller R, RN Outcome: Progressing   Problem: Elimination: Goal: Will not experience complications related to bowel motility 03/13/2018 2055 by Della Gooummings, Argusta Mcgann R, RN Outcome: Progressing 03/13/2018 0719 by Della Gooummings, Maili Shutters R, RN Outcome: Progressing Goal: Will not experience complications related to urinary retention 03/13/2018 2055 by Della Gooummings, Jalysa Swopes R, RN Outcome: Not Progressing Note:  Foley in place 03/13/2018 0719 by Della Gooummings, Zaryia Markel R, RN Outcome: Progressing   Problem: Pain Managment: Goal: General experience of comfort will improve 03/13/2018 2055 by Della Gooummings, Lujean Ebright R, RN Outcome: Progressing Note:  Sedated  03/13/2018 0719 by Della Gooummings, Shawntelle Ungar R, RN Outcome: Progressing   Problem: Safety: Goal: Ability to remain free from injury will improve 03/13/2018 2055 by Della Gooummings, Cristino Degroff R, RN Outcome: Progressing 03/13/2018 0719 by Della Gooummings, Shonn Farruggia R, RN Outcome: Progressing   Problem: Skin Integrity: Goal: Risk for impaired skin integrity will decrease 03/13/2018 2055 by Della Gooummings, Burlene Montecalvo R, RN Outcome: Progressing Note:  Skin intact, pressure ulcer prevention measures in place 03/13/2018 0719 by Della Gooummings, Michiah Masse R, RN Outcome: Progressing

## 2018-03-13 NOTE — Progress Notes (Addendum)
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, G-tube dependent, COPD, CVA with residual left hemiplegia, diabetes mellitus type 2, MDRO including ESBL MRSA VRE Sent over for fever respiratory distress and agonal breathing after having finished a recent course of antibiotics vancomycin and cefepime 5 days previously [2/16 had BC + for S hemolyticus and sputum cult=Pseudomonas, and proteus mirabilis] On admission borderline hypotensive given 1.5 L in the emergency room CXR right lower lobe infiltrate WBC 22 K5.2 creatinine 0.6 lactic acid went from 2.1-3.1 critical care saw patient changed to meropenem/ Linezolide-- placed on contact--also noted sacral and gluteal decubiti with oral pharyngeal thrush as well  Assessment & Plan:   Assessment:  The primary encounter diagnosis was Sepsis, due to unspecified organism ([place]). Diagnoses of HCAP (healthcare-associated pneumonia), Urinary tract infection without hematuria, site unspecified, Ventilator dependent ([place]), Pneumonia, and PNA (pneumonia) were also pertinent to this visit.  Severe sepsis- pneumonia-continue meropenem and Zyvox.  Wounds not infected appearing   Urine cultures NGTD blood culture no growth so far from 3/25 MRSA screen +saline lock IV fluid 75 cc/h--follow respiratory culture WBC 22-->16-->14--7.8 For concern of oropharyngeal change diflucan to Nystatin 500,000 qid 3.29 As per wound nurse note 3.26 left ischial right atrial left trochanter coccygeal left lateral foot and dorsum right hand Wound care consult appreciate their input  malfunctioning PEG-noted oozing around tube Flushed at bedside by RN 3/27and no leakage-feel safe to trial tube feeds and re-initiate-note allergy to Vital Adding  free water flush 200 q8 Stable and improved now  probable dilutional anemia WiIl recheck labs in am-if still below 8 will transfuse 1 Unit and consider Dosing of lovenox for dvt Currently no concerns of dark or tarry stools at this time  Ventilator dependent respiratory failure, G-tube dependent with diverting ostomy-defer management to critical care- Chest x-ray 3/28 look like scarring Placed back on PRVC so resp was asked to reassess and placed back on pressure support on 3/29--pulmonary has seen and is following for vent adjustments  CVA with residual left hemiplegia and 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-continue Keppra 1.5 every 12, Dilantin 100 every 8 note that patient is not on antiplatelet agent  Diabetes mellitus type 2-sliding scale coverage at this time-Sugars are 78-94 control seems in OP to be complicated by hypoglycemia--is on glucagon as OP  Hypothyroidism-continue Synthroid dose po resumed 3.29  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--currently 39/0.4--->resolving to 11/0.33 Phos and mag low on 3/28 am--recheck 3.29 as replaced by CCM protoocl  Venous thrombosis history-continue Lovenox full dose   DVT prophylaxis: lovenox  Code Status:   partial   Family Communication:   Called  Son on number listed in [place] answer--called again 3/29 no answer Disposition Plan: inpatient but could complete care at [facility] if continues to be stable   Consultants:   PCCM  Procedures:   cxr  Antimicrobials:   Merropenem and Zyvox   Subjective:   cannot communicate but remains awake and arousable Not following commands     Objective: Vitals:   03/13/18 1300 03/13/18 1400 03/13/18 1500 03/13/18 1533  BP: 102/60 124/73 (!) 104/55   Pulse: 90 97 88   Resp: (!) 21 (!) 21 18   Temp:    98.4 F (36.9 C)  TempSrc:    Oral  SpO2: 100%  100% 100%   Weight:      Height:        Intake/Output Summary (Last 24 hours) at 03/13/2018 1536 Last data filed at 03/13/2018 1500 Gross per 24 hour   Intake 2552.5 ml  Output 2125 ml  Net 427.5 ml   Filed Weights   03/11/18 0500 03/12/18 0500 03/13/18 0416  Weight: 57.5 kg (126 lb 12.2 oz) 60.3 kg (132 lb 15 oz) 59.4 kg (130 lb 15.3 oz)    Examination:  On ventilator PRBC Cannot examine mouth patient not compliant cta b without added sound, trach in place-clean s1 s 2no m/r/g Wounds not seen today Neuro-moves extremities gcs ~14   Data Reviewed: I have personally reviewed following labs and imaging studies  CBC: Recent Labs  Lab 03/09/18 1831 03/10/18 0408 03/11/18 0524 03/13/18 0324  WBC 22.6* 16.4* 14.1* 7.8  NEUTROABS 20.1* 13.6*  --  4.8  HGB 11.0* 9.0* 8.7* 7.7*  HCT 36.1* 30.9* 29.3* 26.4*  MCV 95.0 93.6 95.1 94.3  PLT PLATELETS APPEAR INCREASED 392 285 532   Basic Metabolic Panel: Recent Labs  Lab 03/09/18 1831  03/09/18 2347 03/10/18 0408 03/11/18 1857 03/12/18 0432 03/12/18 1743 03/13/18 0324  NA 142  --   --  144  145  --  148*  --  143  K 5.2*  --   --  3.9  3.9  --  3.3*  --  4.1  CL 107  --   --  111  110  --  115*  --  111  CO2 26  --   --  26  27  --  26  --  24  GLUCOSE 187*  --   --  112*  111*  --  131*  --  104*  BUN 72*  --   --  39*  38*  --  11  --  9  CREATININE 0.69  --   --  0.40*  0.39*  --  0.33*  --  0.34*  CALCIUM 9.3  --   --  8.9  9.0  --  8.3*  --  8.1*  MG  --   --  2.0  --  1.7 1.6* 2.4 2.1  PHOS  --    < > 2.8 2.7 2.6 1.8* 3.1 2.3*   < > = values in this interval not displayed.   GFR: Estimated Creatinine Clearance: 71.2 mL/min (A) (by C-G formula based on SCr of 0.34 mg/dL (L)). Liver Function Tests: Recent Labs  Lab 03/09/18 1831 03/10/18 0408 03/12/18 0432 03/13/18 0324  AST 54* 21 23  --   ALT 36 23 16*  --   ALKPHOS 179* 134* 102  --   BILITOT 1.1 0.3 0.3  --   PROT 9.8* 8.0 6.4*  --   ALBUMIN 1.8* 1.6*  1.6* 1.2* 1.3*   No results for input(s): LIPASE, AMYLASE in the last 168 hours. No results for input(s): AMMONIA in the last 168  hours. Coagulation Profile: Recent Labs  Lab 03/10/18 0408  INR 1.21   Cardiac Enzymes: No results for input(s): CKTOTAL, CKMB, CKMBINDEX, TROPONINI in the last 168 hours. CBG: Recent Labs  Lab 03/12/18 1943 03/13/18 0017 03/13/18 0331 03/13/18 0747 03/13/18 1139  GLUCAP 120* 77 97 78 96   Urine analysis:    Component Value Date/Time   COLORURINE AMBER (A) 03/09/2018 1831   APPEARANCEUR TURBID (A) 03/09/2018 1831   LABSPEC 1.021 03/09/2018 1831   PHURINE 5.0 03/09/2018 1831  GLUCOSEU NEGATIVE 03/09/2018 1831   HGBUR MODERATE (A) 03/09/2018 1831   BILIRUBINUR NEGATIVE 03/09/2018 1831   KETONESUR NEGATIVE 03/09/2018 1831   PROTEINUR 100 (A) 03/09/2018 1831   NITRITE NEGATIVE 03/09/2018 1831   LEUKOCYTESUR LARGE (A) 03/09/2018 1831     Radiology Studies: Reviewed images personally in health database    Scheduled Meds: . chlorhexidine gluconate (MEDLINE KIT)  15 mL Mouth Rinse BID  . Chlorhexidine Gluconate Cloth  6 each Topical Daily  . collagenase   Topical Daily  . enoxaparin (LOVENOX) injection  50 mg Subcutaneous Q12H  . folic acid  5 mg Oral Daily  . free water  200 mL Per Tube Q8H  . insulin aspart  0-9 Units Subcutaneous Q4H  . ipratropium-albuterol  3 mL Nebulization Q6H  . levETIRAcetam  1,500 mg Per Tube Q12H  . levothyroxine  50 mcg Per Tube QAC breakfast  . mouth rinse  15 mL Mouth Rinse 10 times per day  . metoCLOPramide (REGLAN) injection  5 mg Intravenous Q8H  . metoprolol tartrate  50 mg Per Tube BID  . mupirocin ointment  1 application Nasal BID  . nystatin  5 mL Oral QID  . pantoprazole sodium  40 mg Per Tube QHS  . phenytoin  100 mg Per Tube Q8H  . QUEtiapine  25 mg Per Tube BID  . sodium chloride flush  10-40 mL Intracatheter Q12H   Continuous Infusions: . feeding supplement (PIVOT 1.5 CAL) Stopped (03/13/18 1332)  . linezolid (ZYVOX) IV Stopped (03/13/18 1031)  . meropenem (MERREM) IV Stopped (03/13/18 1242)     LOS: 4 days     Time spent: [place], MD [facility]   If 7PM-7AM, please contact night-coverage [email or web address] Password [facility] 03/13/2018, 3:36 PM

## 2018-03-13 NOTE — Plan of Care (Signed)
N: Baseline non-interactive, w/d from pain. Resp: PRVC via trach. Cv: VS WNL and stable. Int: wound dressings changed today per orders. Plan: transfer to [facility] when bed available.

## 2018-03-13 NOTE — Progress Notes (Signed)
[facility] does not have bed available.   Osborne Cascoadia Jakory Matsuo LCSW [phone]

## 2018-03-14 ENCOUNTER — Inpatient Hospital Stay (HOSPITAL_COMMUNITY): Payer: Medicare Other

## 2018-03-14 DIAGNOSIS — A419 Sepsis, unspecified organism: Secondary | ICD-10-CM | POA: Diagnosis not present

## 2018-03-14 LAB — CULTURE, BLOOD (ROUTINE X 2)
Culture: NO GROWTH
Culture: NO GROWTH
SPECIAL REQUESTS: ADEQUATE
Special Requests: ADEQUATE

## 2018-03-14 LAB — GLUCOSE, CAPILLARY
Glucose-Capillary: 102 mg/dL — ABNORMAL HIGH (ref 65–99)
Glucose-Capillary: 110 mg/dL — ABNORMAL HIGH (ref 65–99)
Glucose-Capillary: 111 mg/dL — ABNORMAL HIGH (ref 65–99)
Glucose-Capillary: 72 mg/dL (ref 65–99)
Glucose-Capillary: 75 mg/dL (ref 65–99)
Glucose-Capillary: 97 mg/dL (ref 65–99)

## 2018-03-14 LAB — CULTURE, BLOOD (SINGLE)
CULTURE: NO GROWTH
SPECIAL REQUESTS: ADEQUATE

## 2018-03-14 MED ORDER — TRAMADOL HCL 50 MG PO TABS
50.0000 mg | ORAL_TABLET | Freq: Three times a day (TID) | ORAL | Status: DC | PRN
  Administered 2018-03-22 (×2): 50 mg
  Filled 2018-03-14 (×2): qty 1

## 2018-03-14 MED ORDER — SODIUM CHLORIDE 0.9 % IV BOLUS
500.0000 mL | Freq: Once | INTRAVENOUS | Status: AC
  Administered 2018-03-14: 500 mL via INTRAVENOUS

## 2018-03-14 MED ORDER — MORPHINE SULFATE (PF) 4 MG/ML IV SOLN
INTRAVENOUS | Status: AC
  Administered 2018-03-14: 1 mg via INTRAVENOUS
  Filled 2018-03-14: qty 1

## 2018-03-14 MED ORDER — FUROSEMIDE 10 MG/ML IJ SOLN
40.0000 mg | Freq: Once | INTRAMUSCULAR | Status: AC
  Administered 2018-03-14: 40 mg via INTRAVENOUS
  Filled 2018-03-14: qty 4

## 2018-03-14 MED ORDER — FENTANYL CITRATE (PF) 100 MCG/2ML IJ SOLN
50.0000 ug | Freq: Once | INTRAMUSCULAR | Status: AC
  Administered 2018-03-14: 50 ug via INTRAVENOUS

## 2018-03-14 MED ORDER — FENTANYL CITRATE (PF) 100 MCG/2ML IJ SOLN
INTRAMUSCULAR | Status: AC
  Administered 2018-03-14: 50 ug via INTRAVENOUS
  Filled 2018-03-14: qty 2

## 2018-03-14 MED ORDER — IPRATROPIUM-ALBUTEROL 0.5-2.5 (3) MG/3ML IN SOLN
3.0000 mL | Freq: Three times a day (TID) | RESPIRATORY_TRACT | Status: DC
  Administered 2018-03-14 – 2018-03-15 (×5): 3 mL via RESPIRATORY_TRACT
  Filled 2018-03-14 (×5): qty 3

## 2018-03-14 MED ORDER — MORPHINE SULFATE (CONCENTRATE) 10 MG/0.5ML PO SOLN
2.0000 mg | ORAL | Status: DC | PRN
  Administered 2018-03-18 – 2018-03-23 (×6): 2 mg
  Filled 2018-03-14 (×6): qty 0.5

## 2018-03-14 MED ORDER — FENTANYL CITRATE (PF) 100 MCG/2ML IJ SOLN
50.0000 ug | INTRAMUSCULAR | Status: DC | PRN
  Administered 2018-03-15 – 2018-03-17 (×4): 100 ug via INTRAVENOUS
  Administered 2018-03-17: 50 ug via INTRAVENOUS
  Administered 2018-03-18: 100 ug via INTRAVENOUS
  Filled 2018-03-14 (×7): qty 2

## 2018-03-14 MED ORDER — MORPHINE SULFATE (PF) 4 MG/ML IV SOLN
1.0000 mg | Freq: Once | INTRAVENOUS | Status: AC
  Administered 2018-03-14: 1 mg via INTRAVENOUS

## 2018-03-14 NOTE — Progress Notes (Signed)
Cal from hospitalist - call taken from remote location   S: In talking to triad MD and bedside RN - after dressing change patient agitated and then placed on PRVC. AFter this per RN he is calmer but tachycardic. Findings suggest he is in pain. 1mg  morphine did not help. RN also thinks patient is volume overloaded and was given lasix  O Vitals:   03/14/18 1300 03/14/18 1400 03/14/18 1500 03/14/18 1543  BP: 98/66 116/66 (!) 108/54   Pulse: (!) 103 96 95   Resp: 18 12 17    Temp:      TempSrc:      SpO2: 100% 100% 100% 100%  Weight:      Height:        No results found. - cxr but my persona view 16.41 film shows trach in position . And cxr is unchnaged   PULMONARY Recent Labs  Lab 03/09/18 2254 03/09/18 2311  PHART  --  7.446  PCO2ART  --  41.8  PO2ART  --  161.0*  HCO3 30.1* 28.6*  TCO2 32 30  O2SAT 54.0 99.0    CBC Recent Labs  Lab 03/10/18 0408 03/11/18 0524 03/13/18 0324  HGB 9.0* 8.7* 7.7*  HCT 30.9* 29.3* 26.4*  WBC 16.4* 14.1* 7.8  PLT 392 285 323    COAGULATION Recent Labs  Lab 03/10/18 0408  INR 1.21    CARDIAC  No results for input(s): TROPONINI in the last 168 hours. No results for input(s): PROBNP in the last 168 hours.   CHEMISTRY Recent Labs  Lab 03/09/18 1831  03/09/18 2347 03/10/18 0408 03/11/18 1857 03/12/18 0432 03/12/18 1743 03/13/18 0324  NA 142  --   --  144  145  --  148*  --  143  K 5.2*  --   --  3.9  3.9  --  3.3*  --  4.1  CL 107  --   --  111  110  --  115*  --  111  CO2 26  --   --  26  27  --  26  --  24  GLUCOSE 187*  --   --  112*  111*  --  131*  --  104*  BUN 72*  --   --  39*  38*  --  11  --  9  CREATININE 0.69  --   --  0.40*  0.39*  --  0.33*  --  0.34*  CALCIUM 9.3  --   --  8.9  9.0  --  8.3*  --  8.1*  MG  --   --  2.0  --  1.7 1.6* 2.4 2.1  PHOS  --    < > 2.8 2.7 2.6 1.8* 3.1 2.3*   < > = values in this interval not displayed.   Estimated Creatinine Clearance: 73 mL/min (A) (by C-G formula  based on SCr of 0.34 mg/dL (L)).   LIVER Recent Labs  Lab 03/09/18 1831 03/10/18 0408 03/12/18 0432 03/13/18 0324  AST 54* 21 23  --   ALT 36 23 16*  --   ALKPHOS 179* 134* 102  --   BILITOT 1.1 0.3 0.3  --   PROT 9.8* 8.0 6.4*  --   ALBUMIN 1.8* 1.6*  1.6* 1.2* 1.3*  INR  --  1.21  --   --      INFECTIOUS Recent Labs  Lab 03/09/18 1845 03/09/18 2025 03/09/18 2347 03/10/18 0408  LATICACIDVEN 2.15* 3.10*  --  1.3  PROCALCITON  --   --  0.13 0.18     ENDOCRINE CBG (last 3)  Recent Labs    03/14/18 0748 03/14/18 1146 03/14/18 1701  GLUCAP 75 102* 111*         IMAGING x48h  - image(s) personally visualized  -   highlighted in bold No results found.   A) Likely pain related agitation/tachycardia  P Fentanyl 50-246mcg Q2h prn. (1mg  morphine might not be enough) RN to be in touch based on response    Dr. Kalman Shan, M.D., [facility].C.P Pulmonary and Critical Care Medicine Staff Physician, [facility] Center Director - Interstitial Lung Disease  Program  Pulmonary Fibrosis [facility] at [facility] [place], [place], [address]  Pager: [phone], If no answer or between  15:00h - 7:00h: call [phone] Telephone: [phone]

## 2018-03-14 NOTE — Progress Notes (Signed)
Called by Dr. To assess pt.  Pt very agitated. Tried pt on Pressure control but could not tell a difference.  Pt looked like he was in pain.  Suctioned pt and placed back on original settings.  Nurse to give more meds.

## 2018-03-14 NOTE — Progress Notes (Signed)
Pharmacy Antibiotic Note  Joe Sandoval is a 72 y.o. male admitted on 03/09/2018 from [facility] with abnormal ABG, hyperglycemia, tachycardia and agonal breathing.  Per RN at [facility], patient just completed a course of vancomycin and cefepime for PNA.  Patient is on meropenem and linezolid for PNA, UTI and decub ulcer given history of ESBL.  ID approved Zyvox due to history of VRE and MRSA. Currently on D#5 of abx therapy. Remains cx negative   Plan: -Zyvox 600mg  IV Q12H -Merrem 1gm IV Q8H -Monitor renal fxn, clinical progress  Height: 6' (182.9 cm) Weight: 134 lb 4.2 oz (60.9 kg) IBW/kg (Calculated) : 77.6  Temp (24hrs), Avg:98.4 F (36.9 C), Min:97.7 F (36.5 C), Max:98.8 F (37.1 C)  Recent Labs  Lab 03/09/18 1831 03/09/18 1845 03/09/18 2025 03/10/18 0408 03/11/18 0524 03/12/18 0432 03/13/18 0324  WBC 22.6*  --   --  16.4* 14.1*  --  7.8  CREATININE 0.69  --   --  0.40*  0.39*  --  0.33* 0.34*  LATICACIDVEN  --  2.15* 3.10* 1.3  --   --   --     Estimated Creatinine Clearance: 73 mL/min (A) (by C-G formula based on SCr of 0.34 mg/dL (L)).    Allergies  Allergen Reactions  . Iodine Other (See Comments)    Unknown reaction (listed on [facility] paperwork)  . Shellfish Allergy Other (See Comments)    Unknown reaction (listed on [facility] Paperwork    Merrem 3/25 >> Zyvox 3/25 >> Fluconazole 3/27>>3/28  3/25 BCx - neg 3/25 UCx - neg 3/26 MRSA PCR pos    Thank you for allowing pharmacy to be a part of this patient's care.  Vinnie LevelBenjamin Yoselyn Mcglade, PharmD., BCPS Clinical Pharmacist Clinical phone for 03/14/18 until 3:30pm: [phone] If after 3:30pm, please call main pharmacy at: [phone]

## 2018-03-14 NOTE — Progress Notes (Signed)
eLink Physician-Brief Progress Note Patient Name: Joe ArtisJames Sandoval DOB: 1946-09-06 MRN: [id number]   Date of Service  03/14/2018  HPI/Events of Note  Hypotension - BP = 95/44 with MAP = 59.   eICU Interventions  Will bolus with 0.9 NaCl 500 mL IV over 1 hour now.      Intervention Category Major Interventions: Hypotension - evaluation and management  Finlee Concepcion Dennard Nipugene 03/14/2018, 9:46 PM

## 2018-03-14 NOTE — Plan of Care (Signed)
Remains on vent via trach, now on weaning settings; patient is tolerating well. Foley remains. Q shift wound care. Tfs via PEG remain unchanged. Still waiting for return transfer to [facility].

## 2018-03-14 NOTE — Progress Notes (Signed)
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, G-tube dependent, COPD, CVA with residual left hemiplegia, diabetes mellitus type 2, MDRO including ESBL MRSA VRE Sent over for fever respiratory distress and agonal breathing after having finished a recent course of antibiotics vancomycin and cefepime 5 days previously [2/16 had BC + for S hemolyticus and sputum cult=Pseudomonas, and proteus mirabilis] On admission borderline hypotensive given 1.5 L in the emergency room CXR right lower lobe infiltrate WBC 22 K5.2 creatinine 0.6 lactic acid went from 2.1-3.1 critical care saw patient changed to meropenem/ Linezolide-- placed on contact--also noted sacral and gluteal decubiti with oral pharyngeal thrush as well  Assessment & Plan:   Assessment:  The primary encounter diagnosis was Sepsis, due to unspecified organism ([place]). Diagnoses of HCAP (healthcare-associated pneumonia), Urinary tract infection without hematuria, site unspecified, Ventilator dependent ([place]), Pneumonia, and PNA (pneumonia) were also pertinent to this visit.   Ventilator dependent respiratory failure, G-tube dependent with diverting ostomy-defer management to critical care- Chest x-ray 3/28 look like scarring--repeat 3/30 done shows improvement to my assesement Placed back on [facility] 3/30 and looking uncomfortable See below RT to re-assess to see if any changes can be made from pulm perspective with RT-otherwise would place back on morphine q 4 as per facility  Severe sepsis- pneumonia-continue meropenem and Zyvox.  Wounds not infected appearing   Urine cultures NGTD blood culture no growth so far from 3/25 MRSA screen +saline lock IV fluid 75 cc/h--follow respiratory culture WBC  22-->16-->14--7.8 For concern of oropharyngeal change diflucan to Nystatin 500,000 qid 3.29 As per wound nurse note 3.26 left ischial right atrial left trochanter coccygeal left lateral foot and dorsum right hand Wound care consult appreciate their input  malfunctioning PEG-noted oozing around tube Flushed at bedside by RN 3/27and no leakage-feel safe to trial tube feeds and re-initiate-note allergy to Vital Adding free water flush 200 q8 Stable and improved now  probable dilutional anemia Labs ordered for 3/31 if still below 8 will transfuse 1 Unit and consider Dosing of lovenox for dvt Currently no concerns of dark or tarry stools at this time  CVA with residual left hemiplegia and 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-continue Keppra 1.5 every 12, Dilantin 100 every 8 note that patient is not on antiplatelet agent  Diabetes mellitus type 2-sliding scale coverage at this time-Sugars are 78-94 control seems in OP to be complicated by hypoglycemia--is on glucagon as OP  Hypothyroidism-continue Synthroid dose po resumed 3.29  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   Tachy in low 120 range      Objective: Vitals:   03/14/18 1300 03/14/18 1400 03/14/18 1500 03/14/18 1543  BP: 98/66 116/66 (!) 108/54   Pulse: (!) 103 96 95   Resp: 18 12 17  Temp:      TempSrc:      SpO2: 100% 100% 100% 100%  Weight:      Height:        Intake/Output Summary (Last 24 hours) at  03/14/2018 1653 Last data filed at 03/14/2018 1500 Gross per 24 hour  Intake 2637.5 ml  Output 3335 ml  Net -697.5 ml   Filed Weights   03/12/18 0500 03/13/18 0416 03/14/18 0333  Weight: 60.3 kg (132 lb 15 oz) 59.4 kg (130 lb 15.3 oz) 60.9 kg (134 lb 4.2 oz)    Examination:  On ventilator PRVC Cannot examine mouth patient not compliant cta b no rales rhonchi in axilla trach in place-clean s1 s 2no m/r/g Wounds not seen today Neuro-moves extremities gcs ~14   Data Reviewed: I have personally reviewed following labs and imaging studies  CBC: Recent Labs  Lab 03/09/18 1831 03/10/18 0408 03/11/18 0524 03/13/18 0324  WBC 22.6* 16.4* 14.1* 7.8  NEUTROABS 20.1* 13.6*  --  4.8  HGB 11.0* 9.0* 8.7* 7.7*  HCT 36.1* 30.9* 29.3* 26.4*  MCV 95.0 93.6 95.1 94.3  PLT PLATELETS APPEAR INCREASED 392 285 128   Basic Metabolic Panel: Recent Labs  Lab 03/09/18 1831  03/09/18 2347 03/10/18 0408 03/11/18 1857 03/12/18 0432 03/12/18 1743 03/13/18 0324  NA 142  --   --  144  145  --  148*  --  143  K 5.2*  --   --  3.9  3.9  --  3.3*  --  4.1  CL 107  --   --  111  110  --  115*  --  111  CO2 26  --   --  26  27  --  26  --  24  GLUCOSE 187*  --   --  112*  111*  --  131*  --  104*  BUN 72*  --   --  39*  38*  --  11  --  9  CREATININE 0.69  --   --  0.40*  0.39*  --  0.33*  --  0.34*  CALCIUM 9.3  --   --  8.9  9.0  --  8.3*  --  8.1*  MG  --   --  2.0  --  1.7 1.6* 2.4 2.1  PHOS  --    < > 2.8 2.7 2.6 1.8* 3.1 2.3*   < > = values in this interval not displayed.   GFR: Estimated Creatinine Clearance: 73 mL/min (A) (by C-G formula based on SCr of 0.34 mg/dL (L)). Liver Function Tests: Recent Labs  Lab 03/09/18 1831 03/10/18 0408 03/12/18 0432 03/13/18 0324  AST 54* 21 23  --   ALT 36 23 16*  --   ALKPHOS 179* 134* 102  --   BILITOT 1.1 0.3 0.3  --   PROT 9.8* 8.0 6.4*  --   ALBUMIN 1.8* 1.6*  1.6* 1.2* 1.3*   No results for input(s): LIPASE, AMYLASE in the  last 168 hours. No results for input(s): AMMONIA in the last 168 hours. Coagulation Profile: Recent Labs  Lab 03/10/18 0408  INR 1.21   Cardiac Enzymes: No results for input(s): CKTOTAL, CKMB, CKMBINDEX, TROPONINI in the last 168 hours. CBG: Recent Labs  Lab 03/13/18 2023 03/14/18 0049 03/14/18 0507 03/14/18 0748 03/14/18 1146  GLUCAP 100* 72 97 75 102*   Urine analysis:    Component Value Date/Time   COLORURINE AMBER (A) 03/09/2018 1831   APPEARANCEUR  TURBID (A) 03/09/2018 1831   LABSPEC 1.021 03/09/2018 1831   PHURINE 5.0 03/09/2018 1831   GLUCOSEU NEGATIVE 03/09/2018 1831   HGBUR MODERATE (A) 03/09/2018 1831   BILIRUBINUR NEGATIVE 03/09/2018 1831   KETONESUR NEGATIVE 03/09/2018 1831   PROTEINUR 100 (A) 03/09/2018 1831   NITRITE NEGATIVE 03/09/2018 1831   LEUKOCYTESUR LARGE (A) 03/09/2018 1831     Radiology Studies: Reviewed images personally in health database    Scheduled Meds: . chlorhexidine gluconate (MEDLINE KIT)  15 mL Mouth Rinse BID  . Chlorhexidine Gluconate Cloth  6 each Topical Daily  . collagenase   Topical Daily  . enoxaparin (LOVENOX) injection  50 mg Subcutaneous Q12H  . folic acid  5 mg Oral Daily  . free water  200 mL Per Tube Q8H  . furosemide  40 mg Intravenous Once  . insulin aspart  0-9 Units Subcutaneous Q4H  . ipratropium-albuterol  3 mL Nebulization TID  . levETIRAcetam  1,500 mg Per Tube Q12H  . levothyroxine  50 mcg Per Tube QAC breakfast  . mouth rinse  15 mL Mouth Rinse 10 times per day  . metoCLOPramide (REGLAN) injection  5 mg Intravenous Q8H  . metoprolol tartrate  50 mg Per Tube BID  . mupirocin ointment  1 application Nasal BID  . nystatin  5 mL Oral QID  . pantoprazole sodium  40 mg Per Tube QHS  . phenytoin  100 mg Per Tube Q8H  . QUEtiapine  25 mg Per Tube BID  . sodium chloride flush  10-40 mL Intracatheter Q12H   Continuous Infusions: . feeding supplement (PIVOT 1.5 CAL) Stopped (03/14/18 1205)  . linezolid  (ZYVOX) IV Stopped (03/14/18 0945)  . meropenem (MERREM) IV Stopped (03/14/18 1237)     LOS: 5 days    Time spent: [place], MD [facility]   If 7PM-7AM, please contact night-coverage [email or web address] Password [email or web address] 03/14/2018, 4:53 PM

## 2018-03-15 ENCOUNTER — Inpatient Hospital Stay (HOSPITAL_COMMUNITY): Payer: Medicare Other

## 2018-03-15 DIAGNOSIS — Z9911 Dependence on respirator [ventilator] status: Secondary | ICD-10-CM | POA: Diagnosis not present

## 2018-03-15 DIAGNOSIS — J189 Pneumonia, unspecified organism: Secondary | ICD-10-CM | POA: Diagnosis not present

## 2018-03-15 DIAGNOSIS — A419 Sepsis, unspecified organism: Secondary | ICD-10-CM | POA: Diagnosis not present

## 2018-03-15 LAB — CBC WITH DIFFERENTIAL/PLATELET
BASOS PCT: 0 %
Basophils Absolute: 0 10*3/uL (ref 0.0–0.1)
EOS ABS: 0.9 10*3/uL — AB (ref 0.0–0.7)
EOS PCT: 13 %
HCT: 26.8 % — ABNORMAL LOW (ref 39.0–52.0)
Hemoglobin: 7.7 g/dL — ABNORMAL LOW (ref 13.0–17.0)
LYMPHS ABS: 1.4 10*3/uL (ref 0.7–4.0)
Lymphocytes Relative: 19 %
MCH: 26.7 pg (ref 26.0–34.0)
MCHC: 28.7 g/dL — AB (ref 30.0–36.0)
MCV: 93.1 fL (ref 78.0–100.0)
Monocytes Absolute: 0.6 10*3/uL (ref 0.1–1.0)
Monocytes Relative: 8 %
Neutro Abs: 4.5 10*3/uL (ref 1.7–7.7)
Neutrophils Relative %: 60 %
PLATELETS: 301 10*3/uL (ref 150–400)
RBC: 2.88 MIL/uL — AB (ref 4.22–5.81)
RDW: 16.8 % — ABNORMAL HIGH (ref 11.5–15.5)
WBC: 7.5 10*3/uL (ref 4.0–10.5)

## 2018-03-15 LAB — HEMOGLOBIN AND HEMATOCRIT, BLOOD
HCT: 34.5 % — ABNORMAL LOW (ref 39.0–52.0)
HEMOGLOBIN: 10.6 g/dL — AB (ref 13.0–17.0)

## 2018-03-15 LAB — RENAL FUNCTION PANEL
Albumin: 1.3 g/dL — ABNORMAL LOW (ref 3.5–5.0)
Anion gap: 5 (ref 5–15)
BUN: 9 mg/dL (ref 6–20)
CALCIUM: 8.1 mg/dL — AB (ref 8.9–10.3)
CO2: 28 mmol/L (ref 22–32)
CREATININE: 0.39 mg/dL — AB (ref 0.61–1.24)
Chloride: 106 mmol/L (ref 101–111)
GFR calc non Af Amer: >60 mL/min (ref >60)
GLUCOSE: 107 mg/dL — AB (ref 65–99)
Phosphorus: 2.5 mg/dL (ref 2.5–4.6)
Potassium: 4 mmol/L (ref 3.5–5.1)
SODIUM: 139 mmol/L (ref 135–145)

## 2018-03-15 LAB — GLUCOSE, CAPILLARY
GLUCOSE-CAPILLARY: 100 mg/dL — AB (ref 65–99)
GLUCOSE-CAPILLARY: 95 mg/dL (ref 65–99)
GLUCOSE-CAPILLARY: 94 mg/dL (ref 65–99)
Glucose-Capillary: 100 mg/dL — ABNORMAL HIGH (ref 65–99)
Glucose-Capillary: 104 mg/dL — ABNORMAL HIGH (ref 65–99)
Glucose-Capillary: 119 mg/dL — ABNORMAL HIGH (ref 65–99)

## 2018-03-15 LAB — ABO/RH: ABO/RH(D): O POS

## 2018-03-15 LAB — PREPARE RBC (CROSSMATCH)

## 2018-03-15 MED ORDER — FUROSEMIDE 10 MG/ML IJ SOLN
20.0000 mg | Freq: Once | INTRAMUSCULAR | Status: AC
  Administered 2018-03-15: 20 mg via INTRAVENOUS
  Filled 2018-03-15: qty 2

## 2018-03-15 MED ORDER — FUROSEMIDE 10 MG/ML IJ SOLN
20.0000 mg | Freq: Once | INTRAMUSCULAR | Status: AC
  Administered 2018-03-16: 20 mg via INTRAVENOUS
  Filled 2018-03-15: qty 2

## 2018-03-15 MED ORDER — SODIUM CHLORIDE 0.9 % IV SOLN
Freq: Once | INTRAVENOUS | Status: AC
  Administered 2018-03-15: 11:00:00 via INTRAVENOUS

## 2018-03-15 NOTE — Progress Notes (Signed)
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, G-tube dependent, COPD, CVA with residual left hemiplegia, diabetes mellitus type 2, MDRO including ESBL MRSA VRE Sent over for fever respiratory distress and agonal breathing after having finished a recent course of antibiotics vancomycin and cefepime 5 days previously [2/16 had BC + for S hemolyticus and sputum cult=Pseudomonas, and proteus mirabilis] On admission borderline hypotensive given 1.5 L in the emergency room CXR right lower lobe infiltrate WBC 22 K5.2 creatinine 0.6 lactic acid went from 2.1-3.1 critical care saw patient changed to meropenem/ Linezolide-- placed on contact--also noted sacral and gluteal decubiti with oral pharyngeal thrush as well  Assessment & Plan:   Assessment:  The primary encounter diagnosis was Sepsis, due to unspecified organism ([place]). Diagnoses of HCAP (healthcare-associated pneumonia), Urinary tract infection without hematuria, site unspecified, Ventilator dependent ([place]), Pneumonia, and PNA (pneumonia) were also pertinent to this visit.   Ventilator dependent respiratory failure, G-tube dependent with diverting ostomy-defer management to critical care- Chest x-ray 3/28 look like scarring--repeat 3/30 done shows improvement to my assesement Vent asynchrony and discomfort managed by PCCM 3/30--given fentanly--much more comfortable--now back on PS and weaning  Severe sepsis- pneumonia-continue meropenem and Zyvox.   Wounds not reviewed recently-As per wound nurse note 3.26 left ischial right atrial left trochanter coccygeal left lateral foot and dorsum right hand Urine cultures NGTDblood culture no growth so far from 3/25 MRSA screen + saline lock 75 cc/h  3/31--unfortunately respiratory cult not done on admit WBC 22-->16-->14--7.8 For concern of oropharyngeal change diflucan to Nystatin 500,000 qid 3.29  transient hypotension 3/30 Related to Fentanyl dosing Resolved with saline bolus No further work-up--not related to sepsis  malfunctioning PEG-noted oozing around tube Flushed at bedside by RN 3/27and no leakage-feel safe to trial tube feeds and re-initiate-note allergy to Vital Adding free water flush 200 q8 Stable and improved now  probable dilutional anemia hemoglobin still 7.7--2 U prbc transfused 3/31 Currently no concerns of dark or tarry stools at this time  CVA with residual left hemiplegia and 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 -continue Keppra 1.5 every 12, - Dilantin 100 every 8 ,not on antiplatelet agent  Diabetes mellitus type 2-sliding scale coverage at this time-Sugars are 78-94 control seems in OP to be complicated by hypoglycemia--is on glucagon as OP  Hypothyroidism-continue Synthroid dose po resumed 3.29  AKI-on admit 72/0.6--currently 39/0.4--->resolving to 11/0.33 Phos and mag low on 3/28 am--recheck 3.31   Venous thrombosis history-continue Lovenox full dose   DVT prophylaxis: lovenox  Code Status:   partial   Family Communication:  Multiple calls made to family- Disposition Plan: transfer back to [place] [facility] if stabilizes   Consultants:   PCCM  Procedures:   cxr  Antimicrobials:   Merropenem and Zyvox   Subjective:  Resting comfortably without distress Agitation and tachy cardia from 3/30 resolved   Objective: Vitals:   03/15/18 1150 03/15/18 1200 03/15/18 1208 03/15/18 1210  BP:  (!) 108/57 (!) 108/57 (!) 108/57  Pulse:  81 80 81  Resp:  19 19 19   Temp: 98.5 F (36.9 C)  98.5 F (36.9 C) 98.5 F (36.9 C)  TempSrc: Oral     SpO2:  100% 100% 100%  Weight:      Height:  Intake/Output Summary (Last 24 hours) at 03/15/2018 1237 Last data filed at 03/15/2018 1208 Gross per 24 hour   Intake 2947.17 ml  Output 3680 ml  Net -732.83 ml   Filed Weights   03/13/18 0416 03/14/18 0333 03/15/18 0500  Weight: 59.4 kg (130 lb 15.3 oz) 60.9 kg (134 lb 4.2 oz) 58.5 kg (128 lb 15.5 oz)    Examination:  On ventilator on PS currently, looks more comfortable Cannot examine mouth patient not compliant RUE PICC line in place cta b no rales rhonchi in axilla trach in place-clean s1 s 2no m/r/g Wounds not seen today-UE's swollen  Neuro-moves extremities gcs ~14   Data Reviewed: I have personally reviewed following labs and imaging studies  CBC: Recent Labs  Lab 03/09/18 1831 03/10/18 0408 03/11/18 0524 03/13/18 0324 03/15/18 0454  WBC 22.6* 16.4* 14.1* 7.8 7.5  NEUTROABS 20.1* 13.6*  --  4.8 4.5  HGB 11.0* 9.0* 8.7* 7.7* 7.7*  HCT 36.1* 30.9* 29.3* 26.4* 26.8*  MCV 95.0 93.6 95.1 94.3 93.1  PLT PLATELETS APPEAR INCREASED 392 285 323 161   Basic Metabolic Panel: Recent Labs  Lab 03/09/18 1831  03/09/18 2347 03/10/18 0408 03/11/18 1857 03/12/18 0432 03/12/18 1743 03/13/18 0324 03/15/18 0454  NA 142  --   --  144  145  --  148*  --  143 139  K 5.2*  --   --  3.9  3.9  --  3.3*  --  4.1 4.0  CL 107  --   --  111  110  --  115*  --  111 106  CO2 26  --   --  26  27  --  26  --  24 28  GLUCOSE 187*  --   --  112*  111*  --  131*  --  104* 107*  BUN 72*  --   --  39*  38*  --  11  --  9 9  CREATININE 0.69  --   --  0.40*  0.39*  --  0.33*  --  0.34* 0.39*  CALCIUM 9.3  --   --  8.9  9.0  --  8.3*  --  8.1* 8.1*  MG  --   --  2.0  --  1.7 1.6* 2.4 2.1  --   PHOS  --    < > 2.8 2.7 2.6 1.8* 3.1 2.3* 2.5   < > = values in this interval not displayed.   GFR: Estimated Creatinine Clearance: 70.1 mL/min (A) (by C-G formula based on SCr of 0.39 mg/dL (L)). Liver Function Tests: Recent Labs  Lab 03/09/18 1831 03/10/18 0408 03/12/18 0432 03/13/18 0324 03/15/18 0454  AST 54* 21 23  --   --   ALT 36 23 16*  --   --   ALKPHOS 179* 134* 102  --   --    BILITOT 1.1 0.3 0.3  --   --   PROT 9.8* 8.0 6.4*  --   --   ALBUMIN 1.8* 1.6*  1.6* 1.2* 1.3* 1.3*   No results for input(s): LIPASE, AMYLASE in the last 168 hours. No results for input(s): AMMONIA in the last 168 hours. Coagulation Profile: Recent Labs  Lab 03/10/18 0408  INR 1.21   Cardiac Enzymes: No results for input(s): CKTOTAL, CKMB, CKMBINDEX, TROPONINI in the last 168 hours. CBG: Recent Labs  Lab 03/14/18 2008 03/15/18 0024 03/15/18 0347 03/15/18 0808 03/15/18 1203  GLUCAP 110* 100* 104* 119* 100*  Urine analysis:    Component Value Date/Time   COLORURINE AMBER (A) 03/09/2018 1831   APPEARANCEUR TURBID (A) 03/09/2018 1831   LABSPEC 1.021 03/09/2018 1831   PHURINE 5.0 03/09/2018 1831   GLUCOSEU NEGATIVE 03/09/2018 1831   HGBUR MODERATE (A) 03/09/2018 1831   BILIRUBINUR NEGATIVE 03/09/2018 1831   KETONESUR NEGATIVE 03/09/2018 1831   PROTEINUR 100 (A) 03/09/2018 1831   NITRITE NEGATIVE 03/09/2018 1831   LEUKOCYTESUR LARGE (A) 03/09/2018 1831     Radiology Studies: Reviewed images personally in health database    Scheduled Meds: . chlorhexidine gluconate (MEDLINE KIT)  15 mL Mouth Rinse BID  . Chlorhexidine Gluconate Cloth  6 each Topical Daily  . collagenase   Topical Daily  . enoxaparin (LOVENOX) injection  50 mg Subcutaneous Q12H  . folic acid  5 mg Oral Daily  . free water  200 mL Per Tube Q8H  . furosemide  20 mg Intravenous Once  . insulin aspart  0-9 Units Subcutaneous Q4H  . ipratropium-albuterol  3 mL Nebulization TID  . levETIRAcetam  1,500 mg Per Tube Q12H  . levothyroxine  50 mcg Per Tube QAC breakfast  . mouth rinse  15 mL Mouth Rinse 10 times per day  . metoCLOPramide (REGLAN) injection  5 mg Intravenous Q8H  . metoprolol tartrate  50 mg Per Tube BID  . mupirocin ointment  1 application Nasal BID  . nystatin  5 mL Oral QID  . pantoprazole sodium  40 mg Per Tube QHS  . phenytoin  100 mg Per Tube Q8H  . QUEtiapine  25 mg Per Tube  BID  . sodium chloride flush  10-40 mL Intracatheter Q12H   Continuous Infusions: . feeding supplement (PIVOT 1.5 CAL) 1,000 mL (03/15/18 0447)  . linezolid (ZYVOX) IV Stopped (03/15/18 1207)  . meropenem (MERREM) IV 1 g (03/15/18 1217)     LOS: 6 days    Time spent: [place], MD [facility]   If 7PM-7AM, please contact night-coverage [email or web address] Password [facility] 03/15/2018, 12:37 PM

## 2018-03-16 ENCOUNTER — Inpatient Hospital Stay (HOSPITAL_COMMUNITY): Payer: Medicare Other

## 2018-03-16 DIAGNOSIS — J9611 Chronic respiratory failure with hypoxia: Secondary | ICD-10-CM

## 2018-03-16 DIAGNOSIS — A419 Sepsis, unspecified organism: Secondary | ICD-10-CM | POA: Diagnosis not present

## 2018-03-16 LAB — RENAL FUNCTION PANEL
ANION GAP: 7 (ref 5–15)
Albumin: 1.5 g/dL — ABNORMAL LOW (ref 3.5–5.0)
BUN: 10 mg/dL (ref 6–20)
CHLORIDE: 103 mmol/L (ref 101–111)
CO2: 27 mmol/L (ref 22–32)
Calcium: 8.3 mg/dL — ABNORMAL LOW (ref 8.9–10.3)
Creatinine, Ser: 0.35 mg/dL — ABNORMAL LOW (ref 0.61–1.24)
GFR calc non Af Amer: >60 mL/min (ref >60)
Glucose, Bld: 106 mg/dL — ABNORMAL HIGH (ref 65–99)
Phosphorus: 2.1 mg/dL — ABNORMAL LOW (ref 2.5–4.6)
Potassium: 3.8 mmol/L (ref 3.5–5.1)
Sodium: 137 mmol/L (ref 135–145)

## 2018-03-16 LAB — BPAM RBC
Blood Product Expiration Date: 201904252359
Blood Product Expiration Date: 201904252359
ISSUE DATE / TIME: 201903311059
ISSUE DATE / TIME: 201903311059
Unit Type and Rh: 5100
Unit Type and Rh: 5100

## 2018-03-16 LAB — CBC WITH DIFFERENTIAL/PLATELET
Basophils Absolute: 0 10*3/uL (ref 0.0–0.1)
Basophils Relative: 0 %
EOS PCT: 9 %
Eosinophils Absolute: 0.9 10*3/uL — ABNORMAL HIGH (ref 0.0–0.7)
HCT: 35.9 % — ABNORMAL LOW (ref 39.0–52.0)
Hemoglobin: 10.9 g/dL — ABNORMAL LOW (ref 13.0–17.0)
LYMPHS ABS: 1.6 10*3/uL (ref 0.7–4.0)
LYMPHS PCT: 17 %
MCH: 27.7 pg (ref 26.0–34.0)
MCHC: 30.4 g/dL (ref 30.0–36.0)
MCV: 91.3 fL (ref 78.0–100.0)
MONOS PCT: 10 %
Monocytes Absolute: 0.9 10*3/uL (ref 0.1–1.0)
Neutro Abs: 5.9 10*3/uL (ref 1.7–7.7)
Neutrophils Relative %: 64 %
Platelets: 362 10*3/uL (ref 150–400)
RBC: 3.93 MIL/uL — AB (ref 4.22–5.81)
RDW: 17.1 % — ABNORMAL HIGH (ref 11.5–15.5)
WBC: 9.3 10*3/uL (ref 4.0–10.5)

## 2018-03-16 LAB — TYPE AND SCREEN
ABO/RH(D): O POS
ANTIBODY SCREEN: NEGATIVE
UNIT DIVISION: 0
Unit division: 0

## 2018-03-16 LAB — GLUCOSE, CAPILLARY
GLUCOSE-CAPILLARY: 110 mg/dL — AB (ref 65–99)
GLUCOSE-CAPILLARY: 119 mg/dL — AB (ref 65–99)
GLUCOSE-CAPILLARY: 111 mg/dL — AB (ref 65–99)
Glucose-Capillary: 118 mg/dL — ABNORMAL HIGH (ref 65–99)
Glucose-Capillary: 91 mg/dL (ref 65–99)
Glucose-Capillary: 93 mg/dL (ref 65–99)
Glucose-Capillary: 95 mg/dL (ref 65–99)

## 2018-03-16 MED ORDER — POTASSIUM PHOSPHATE MONOBASIC 500 MG PO TABS: 500.0000 mg | ORAL_TABLET | Freq: Three times a day (TID) | ORAL | Status: AC

## 2018-03-16 MED ORDER — IPRATROPIUM-ALBUTEROL 0.5-2.5 (3) MG/3ML IN SOLN
3.0000 mL | Freq: Two times a day (BID) | RESPIRATORY_TRACT | Status: DC
  Administered 2018-03-16 – 2018-03-23 (×15): 3 mL via RESPIRATORY_TRACT
  Filled 2018-03-16 (×15): qty 3

## 2018-03-16 MED ORDER — LEVETIRACETAM 100 MG/ML PO SOLN: 1500.0000 mg | Freq: Two times a day (BID) | ORAL | 12 refills | Status: AC

## 2018-03-16 MED ORDER — ENOXAPARIN SODIUM 60 MG/0.6ML ~~LOC~~ SOLN
60.0000 mg | Freq: Two times a day (BID) | SUBCUTANEOUS | Status: DC
  Administered 2018-03-16 – 2018-03-23 (×14): 60 mg via SUBCUTANEOUS
  Filled 2018-03-16 (×13): qty 0.6

## 2018-03-16 MED ORDER — FREE WATER: 200.0000 mL | Freq: Three times a day (TID) | Status: AC

## 2018-03-16 MED ORDER — PHENYTOIN 125 MG/5ML PO SUSP: 100.0000 mg | Freq: Three times a day (TID) | ORAL | 12 refills | Status: AC

## 2018-03-16 MED ORDER — NYSTATIN 100000 UNIT/ML MT SUSP: 5.0000 mL | Freq: Four times a day (QID) | OROMUCOSAL | 0 refills | Status: DC

## 2018-03-16 MED ORDER — PIVOT 1.5 CAL PO LIQD: 1000.0000 mL | ORAL | 0 refills | Status: AC

## 2018-03-16 NOTE — Progress Notes (Signed)
CSW informed MD ready to DC patient today- CSW check with [facility]- they don't have a bed available today but are checking to see if there are any coming available this week.  CSW will continue to follow  Burna SisJenna H. Kinsler Soeder, LCSW Clinical Social Worker [phone]

## 2018-03-16 NOTE — Discharge Summary (Signed)
Physician Discharge Summary  Joe Sandoval ZOX:[id number] DOB: 1946-07-22 DOA: 03/09/2018  PCP: Crist Fat, MD  Admit date: 03/09/2018 Discharge date: 03/16/2018  Time spent: 35 minutes  Recommendations for Outpatient Follow-up:  1. Suggest collagenase +/- wet-to-dry dressings over multiple unstageable wounds 2. Recommend outpatient goals of care and discussion with family further regarding management 3. Recommend lower dose of morphine 2 mg every 4 as needed given at timing of dressing changes-has been able to wean to pressure support 10/5 for some periods of time during the day otherwise has been using PRVC at 40 4. Recommend complete metabolic panel, CBC 1 week --recommend adding--potassium phosphate 500 3 times daily and recheck within a week 5. Recommend chest x-ray 1 month 6. Stop nystatin swish and swallow On 03/19/2018  Discharge Diagnoses:  Principal Problem:   Sepsis (HCC) Active Problems:   Acute respiratory failure with hypoxia (HCC)   HCAP (healthcare-associated pneumonia)   Ventilator dependent (HCC)   Seizure (HCC)   Leaking PEG tube (HCC)   Decubitus ulcer   Discharge Condition: Guarded overall but improved since admission  Diet recommendation: PEG feeds  Filed Weights   03/14/18 0333 03/15/18 0500 03/16/18 0500  Weight: 60.9 kg (134 lb 4.2 oz) 58.5 kg (128 lb 15.5 oz) 59.8 kg (131 lb 29.4 oz)   72 year old male transferred from [facility] to [facility] 3/26 Prior history of present dependent respiratory failure status post trach, G-tube dependent, COPD, CVA with residual left hemiplegia, diabetes mellitus type 2, MDRO including ESBL MRSA VRE Sent over for fever respiratory distress and agonal breathing after having finished a recent course of antibiotics vancomycin and cefepime 5 days previously [2/16 had BC + for S hemolyticus and sputum cult=Pseudomonas, and proteus mirabilis] On admission borderline hypotensive given 1.5 L in the emergency room CXR right  lower lobe infiltrate WBC 22 K5.2 creatinine 0.6 lactic acid went from 2.1-3.1 critical care saw patient changed to meropenem/ Linezolide-- placed on contact--also noted sacral and gluteal decubiti with oral pharyngeal thrush as well  History of present illness:  Ventilator dependent respiratory failure, G-tube dependent with diverting ostomy-defer management to critical care- Chest x-ray 3/28 look like scarring--repeat 3/31 done shows improvement to my assesement Vent asynchrony and discomfort managed by PCCM 3/30--given fentanly--much more comfortable--now back on PS and weaning and will be able to go back on a rate of PRVC as per skilled nursing facility settings at night if needed -Would recommend morphine for dressing changes but at a lower dose   Severe sepsis- pneumonia-continue meropenem and Zyvox.   Wounds not reviewed recently-As per wound nurse note 3.26 left ischial right atrial left trochanter coccygeal left lateral foot and dorsum right hand Urine cultures NGTDblood culture no growth so far from 3/25 MRSA screen  saline lock 75 cc/h 3/31--unfortunately respiratory cult not done on admit WBC 22-->16-->14--7.8 For concern of oropharyngeal change diflucan to Nystatin 500,000 qid 3.29-- stop date 03/19/2018 as he looks remarkably better on bedside assessment for 1   transient hypotension 3/30 Related to Fentanyl dosing for comfort-see above   malfunctioning PEG-noted oozing around tube Flushed at bedside by RN 3/27and no leakage-feel safe to trial tube feeds and re-initiate-note allergy to Vital Adding free water flush 200 q8 Phos and mag low on 3/28 am-- phosphorus 2.1 so will replace with Neutra-Phos as an outpatient   probable dilutional anemia hemoglobin still 7.7--2 U prbc transfused 3/31 Currently no concerns of dark or tarry stools at this time   CVA with residual left  hemiplegia and 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 -continue Keppra 1.5 every 12, - Dilantin 100 every 8 ,not  on antiplatelet agent   Diabetes mellitus type 2-sliding scale coverage at this time-Sugars are Have been ranging between 90s and 120s control seems in OP to be complicated by hypoglycemia--is on glucagon as OP   Hypothyroidism-continue Synthroid dose po resumed 3.29   AKI-on admit 72/0.6--currently 39/0.4--->resolving to 11/0.33-->10/0.3  Venous thrombosis history-continue Lovenox full dose 50 mg every 12  Procedures:  CT scan  Numerous x-rays   Consultations:  Wound care nurse, pulmonary critical care  Discharge Exam: Vitals:   03/16/18 0750 03/16/18 0800  BP:  (!) 107/49  Pulse: (!) 102 (!) 107  Resp: 15 (!) 21  Temp: 97.7 F (36.5 C)   SpO2: 100% 100%    General: A phasic in no distress unable to obtain ROS but overnight has been comfortable and has not been combative and is currently on pressure support Cardiovascular: S1-S2 no murmur rub or gallop Respiratory: Clinically clear no added sounds no rales PEG tube noted and a dressing around the same has been clean                      Discharge Instructions    Allergies as of 03/16/2018      Reactions   Iodine Other (See Comments)   Unknown reaction (listed on [facility] paperwork)   Shellfish Allergy Other (See Comments)   Unknown reaction (listed on [facility] Paperwork      Medication List    STOP taking these medications   chlorhexidine 0.12 % solution Commonly known as:  PERIDEX   dextrose 50 % solution   eucerin cream   GLUCAGON EMERGENCY 1 MG injection Generic drug:  glucagon   multivitamin with minerals Tabs tablet   NORMAL SALINE FLUSH IV   nystatin powder Generic drug:  nystatin   polyethylene glycol packet Commonly known as:  MIRALAX / GLYCOLAX     TAKE these medications   acetaminophen 325 MG tablet Commonly known as:  TYLENOL Place 650 mg into feeding tube every 4 (four) hours as needed for fever (pain).   Dextrose-Fructose-Sod Citrate 968-175-230 MG Chew 1  tablet by PEG Tube route as needed (BS <70).   feeding supplement (PIVOT 1.5 CAL) Liqd Place 1,000 mLs into feeding tube continuous.   FLORA-Q Caps capsule 1 capsule daily.   FOLIC ACID PO 5 mg by PEG Tube route daily. 5 mg tablets   free water Soln Place 200 mLs into feeding tube every 8 (eight) hours.   HYPER-SAL 7 % Nebu Generic drug:  Sodium Chloride (Inhalant) Inhale 1 vial into the lungs every 6 (six) hours.   insulin lispro 100 UNIT/ML injection Commonly known as:  HUMALOG Inject 0-10 Units into the skin every 6 (six) hours. Per sliding scale: CBG 15-200 2 units, 201-250, 251-300 6 units, 301-350 8 units, 351-400 10 units   ipratropium-albuterol 0.5-2.5 (3) MG/3ML Soln Commonly known as:  DUONEB Take 3 mLs by nebulization every 6 (six) hours.   levETIRAcetam 100 MG/ML solution Commonly known as:  KEPPRA Place 15 mLs (1,500 mg total) into feeding tube 2 (two) times daily. For seizures   levothyroxine 50 MCG tablet Commonly known as:  SYNTHROID, LEVOTHROID Place 50 mcg into feeding tube daily.   LORazepam 2 MG tablet Commonly known as:  ATIVAN Place 2 mg into feeding tube every 4 (four) hours as needed for seizure.   LOVENOX IJ Inject 50 mg into  the skin every 12 (twelve) hours.   metoprolol tartrate 50 MG tablet Commonly known as:  LOPRESSOR Place 50 mg into feeding tube 2 (two) times daily. Hold for BP less than 100   morphine 20 MG/5ML solution Place 4 mg into feeding tube every 4 (four) hours as needed for pain (agitation).   NEUTROGENA T/GEL 4 % Sham Generic drug:  Coal Tar Extract Apply 1 application topically See admin instructions. Apply to head topically at bedtime every Wednesday and Sunday (wash hair with bath)   nystatin 100000 UNIT/ML suspension Commonly known as:  MYCOSTATIN Take 5 mLs (500,000 Units total) by mouth 4 (four) times daily.   ondansetron 4 MG tablet Commonly known as:  ZOFRAN Place 4 mg into feeding tube every 4 (four) hours  as needed for nausea or vomiting.   phenytoin 125 MG/5ML suspension Commonly known as:  DILANTIN Place 4 mLs (100 mg total) into feeding tube every 8 (eight) hours. For seizures   potassium phosphate (monobasic) 500 MG tablet Commonly known as:  K-PHOS ORIGINAL Take 1 tablet (500 mg total) by mouth 4 (four) times daily -  with meals and at bedtime.   PROTONIX 40 mg/20 mL Pack Generic drug:  pantoprazole sodium Place 40 mg into feeding tube daily.   QUEtiapine 25 MG tablet Commonly known as:  SEROQUEL Place 25 mg into feeding tube 2 (two) times daily. For agitation and restlessness   traMADol 50 MG tablet Commonly known as:  ULTRAM Place 50 mg into feeding tube every 6 (six) hours as needed (pain 4-6).      Allergies  Allergen Reactions  . Iodine Other (See Comments)    Unknown reaction (listed on [facility] paperwork)  . Shellfish Allergy Other (See Comments)    Unknown reaction (listed on [facility] Paperwork      The results of significant diagnostics from this hospitalization (including imaging, microbiology, ancillary and laboratory) are listed below for reference.    Significant Diagnostic Studies: Ct Abdomen Pelvis Wo Contrast  Result Date: 03/10/2018 CLINICAL DATA:  72 year old male with abdominal pain and distention. Fever. Concern for abscess. EXAM: CT ABDOMEN AND PELVIS WITHOUT CONTRAST TECHNIQUE: Multidetector CT imaging of the abdomen and pelvis was performed following the standard protocol without IV contrast. COMPARISON:  None. FINDINGS: Evaluation of this exam is limited in the absence of intravenous contrast. Lower chest: There are emphysematous changes of the lung bases with diffuse interstitial coarsening. There is mucous plugging of the bilateral lower lobe bronchi with areas of consolidative changes of the lung bases concerning for pneumonia. Clinical correlation is recommended. There is no intra-abdominal free air or free fluid. Hepatobiliary: The liver is  unremarkable. The gallbladder is unremarkable. Pancreas: Unremarkable. No pancreatic ductal dilatation or surrounding inflammatory changes. Spleen: Normal in size without focal abnormality. Adrenals/Urinary Tract: Minimal left adrenal thickening. The right adrenal gland is unremarkable. The kidneys, and visualized ureters appear unremarkable. The urinary bladder is partially distended. A Foley catheter is noted within the bladder. There is apparent diffuse thickening of the bladder wall which may be partly related to underdistention. Cystitis is not excluded. Correlation with urinalysis recommended. Stomach/Bowel: There is a percutaneous gastrostomy with tip and balloon in the body of the stomach. Contrast injected via the catheter opacifies the stomach. No extraluminal spillage of contrast. There is no bowel obstruction or active inflammation. A left lower quadrant ileostomy is noted. There is postsurgical changes of partial colon resection. Vascular/Lymphatic: Advanced aortoiliac atherosclerotic disease. A vascular stent graft extends from the right  common iliac artery to the right common femoral artery. Evaluation of the vasculature is limited in the absence of intravenous contrast. A 1.9 x 1.5 cm ovoid density along the right common femoral vessels (series 3, image 48) is not well characterized but may represent an enlarged lymph node, a vascular aneurysm, or a retroperitoneal soft tissue lesion/metastatic nodule/lymph node. A smaller nodular density noted more superiorly on series 3, image 40. Reproductive: The prostate and seminal vesicles are grossly unremarkable as visualized. Other: Midline vertical anterior abdominal wall incisional scar. Mild diffuse subcutaneous edema. Musculoskeletal: Osteopenia with degenerative changes of the spine. Multiple sclerotic lesions involving the spine with the largest involving L2-1 and L2 most consistent with metastatic disease. Clinical correlation is recommended. No  acute fracture. Decubitus ulcer over the left greater trochanter and ischial tuberosities. No fluid collection or abscess. Old healed right femoral fracture and partially visualized right femoral fixation hardware. Heterotopic ossification of the soft tissues about the left greater trochanter likely related to chronic infection. IMPRESSION: 1. Mucous impaction in the lower lobe bronchi with findings concerning for bilateral lower lobe pneumonia. Clinical correlation is recommended. 2. Postsurgical changes of the bowel. No evidence of bowel obstruction. No fluid collection or abscess. 3.  Aortic Atherosclerosis (ICD10-I70.0). 4. Right iliac artery endovascular stent graft. 5. Ovoid soft tissue densities along the spine to the right of the aorta concerning for lymphadenopathy. Clinical correlation is recommended. 6. Osseous metastatic disease. Electronically Signed   By: Elgie CollardArash  Radparvar M.D.   On: 03/10/2018 03:50   Dg Chest Port 1 View  Result Date: 03/16/2018 CLINICAL DATA:  Recent pneumonia EXAM: PORTABLE CHEST 1 VIEW COMPARISON:  March 15, 2018 and March 14, 2018 FINDINGS: Tracheostomy catheter tip is 5.5 cm above the carina. Central catheter tip is in the superior vena cava. No pneumothorax. There is atelectatic change in the lung bases with small pleural effusions bilaterally. There is no appreciable airspace consolidation. Heart is upper normal in size with pulmonary vascularity within normal limits. No adenopathy. No bone lesions. IMPRESSION: Tube and catheter positions as described without pneumothorax. Small pleural effusions bilaterally with bibasilar atelectasis. Stable cardiac silhouette. No frank consolidation. Electronically Signed   By: Bretta BangWilliam  Woodruff III M.D.   On: 03/16/2018 07:44   Dg Chest Port 1 View  Result Date: 03/15/2018 CLINICAL DATA:  Follow-up pneumonia. EXAM: PORTABLE CHEST 1 VIEW COMPARISON:  03/14/2018 FINDINGS: The right arm PICC line tip is in the projection of the distal  SVC. There is a tracheostomy tube with tip above the carina. Normal heart size. Diffuse lower lobe predominant coarsened interstitial opacities throughout both lungs are unchanged compared with previous exam. IMPRESSION: 1. Stable support apparatus. 2. No change in aeration to the lungs compared with previous exam. Electronically Signed   By: Signa Kellaylor  Stroud M.D.   On: 03/15/2018 07:25   Dg Chest Port 1 View  Result Date: 03/15/2018 CLINICAL DATA:  Pneumonia. EXAM: PORTABLE CHEST 1 VIEW COMPARISON:  Chest radiograph March 12, 2018 FINDINGS: Cardiac silhouette is mildly enlarged and unchanged. Mediastinal silhouette is nonsuspicious. RIGHT PICC distal tip projects in mid severe vena cava. Tracheostomy tube in situ. Small LEFT greater than RIGHT pleural effusions. Diffuse interstitial prominence, confluent in the lung bases. No pneumothorax. Osteopenia. IMPRESSION: Similar interstitial prominence confluent in the lung bases with small pleural effusions. No apparent change in life-support lines. Electronically Signed   By: Awilda Metroourtnay  Bloomer M.D.   On: 03/15/2018 03:04   Dg Chest Port 1 View  Result Date: 03/12/2018 CLINICAL  DATA:  Pneumonia. EXAM: PORTABLE CHEST 1 VIEW COMPARISON:  Radiograph of March 10, 2018. FINDINGS: Stable cardiomediastinal silhouette and central pulmonary vascular congestion. Tracheostomy tube is unchanged in position. No pneumothorax is noted. Stable bilateral interstitial lung densities are noted concerning for scarring or possibly edema. Mild bilateral pleural effusions are noted. Bony thorax is unremarkable. Right-sided PICC line is noted with distal tip in expected position of the SVC. IMPRESSION: Stable bilateral diffuse interstitial lung densities are noted which may represent scarring or edema. Mild bilateral pleural effusions are noted. Electronically Signed   By: Lupita Raider, M.D.   On: 03/12/2018 15:16   Dg Chest Port 1 View  Result Date: 03/10/2018 CLINICAL DATA:   72 year old male with shortness of breath. EXAM: PORTABLE CHEST 1 VIEW COMPARISON:  Chest radiograph dated 03/09/2018 FINDINGS: Tracheostomy remains above the carina. Right-sided PICC in similar positioning. There is hyperinflation of the lungs with right lung base atelectasis versus scarring. Pneumonia is not excluded. A small right pleural effusion may be present. There is diffuse interstitial coarsening and bronchitic changes. No pneumothorax. The cardiac silhouette is within normal limits. No acute osseous pathology. IMPRESSION: Right lung base atelectasis/scarring versus infiltrate. Probable small right pleural effusion. Electronically Signed   By: Elgie Collard M.D.   On: 03/10/2018 05:06   Dg Chest Port 1 View  Result Date: 03/09/2018 CLINICAL DATA:  Recent pneumonia, sepsis EXAM: PORTABLE CHEST 1 VIEW COMPARISON:  None. FINDINGS: Endotracheal tube 5.8 cm above the carina. Right PICC line tip in the SVC. Heart is normal size. Mild hyperinflation of the lungs. Patchy opacity at the right lung base. Left lung clear. No effusions. No acute bony abnormality. IMPRESSION: Mild hyperinflation. Patchy atelectasis or infiltrate at the right lung base. Electronically Signed   By: Charlett Nose M.D.   On: 03/09/2018 18:44    Microbiology: Recent Results (from the past 240 hour(s))  Blood Culture (routine x 2)     Status: None   Collection Time: 03/09/18  6:31 PM  Result Value Ref Range Status   Specimen Description BLOOD SITE NOT SPECIFIED  Final   Special Requests IN PEDIATRIC BOTTLE Blood Culture adequate volume  Final   Culture   Final    NO GROWTH 5 DAYS Performed at [facility], [address], [place], [place] [address]    Report Status 03/14/2018 FINAL  Final  Culture, blood (single)     Status: None   Collection Time: 03/09/18  7:16 PM  Result Value Ref Range Status   Specimen Description BLOOD RIGHT PICC LINE  Final   Special Requests   Final    BOTTLES DRAWN AEROBIC AND  ANAEROBIC Blood Culture adequate volume   Culture   Final    NO GROWTH 5 DAYS Performed at [facility], [address] [place], [place] [address]    Report Status 03/14/2018 FINAL  Final  Urine culture     Status: None   Collection Time: 03/09/18  7:59 PM  Result Value Ref Range Status   Specimen Description URINE, CATHETERIZED  Final   Special Requests NONE  Final   Culture   Final    NO GROWTH Performed at [facility], [address] [place], [place] [address]    Report Status 03/11/2018 FINAL  Final  Blood Culture (routine x 2)     Status: None   Collection Time: 03/09/18  8:18 PM  Result Value Ref Range Status   Specimen Description BLOOD RIGHT  WRIST  Final   Special Requests IN PEDIATRIC BOTTLE Blood Culture adequate volume  Final   Culture   Final    NO GROWTH 5 DAYS Performed at [facility], [address] [place], [place] [address]    Report Status 03/14/2018 FINAL  Final  MRSA PCR Screening     Status: Abnormal   Collection Time: 03/10/18  4:33 PM  Result Value Ref Range Status   MRSA by PCR POSITIVE (A) NEGATIVE Final    Comment:        The GeneXpert MRSA Assay (FDA approved for NASAL specimens only), is one component of a comprehensive MRSA colonization surveillance program. It is not intended to diagnose MRSA infection nor to guide or monitor treatment for MRSA infections. RESULT CALLED TO, READ BACK BY AND VERIFIED WITH: A.LOWE,RN AT 0014 BY L.PITT 03/11/18      Labs: Basic Metabolic Panel: Recent Labs  Lab 03/09/18 2347 03/10/18 0408 03/11/18 1857 03/12/18 0432 03/12/18 1743 03/13/18 0324 03/15/18 0454 03/16/18 0541  NA  --  144  145  --  148*  --  143 139 137  K  --  3.9  3.9  --  3.3*  --  4.1 4.0 3.8  CL  --  111  110  --  115*  --  111 106 103  CO2  --  26  27  --  26  --  24 28 27   GLUCOSE  --  112*  111*  --  131*  --  104* 107* 106*  BUN  --  39*  38*  --  11  --  9 9 10   CREATININE  --  0.40*  0.39*  --   0.33*  --  0.34* 0.39* 0.35*  CALCIUM  --  8.9  9.0  --  8.3*  --  8.1* 8.1* 8.3*  MG 2.0  --  1.7 1.6* 2.4 2.1  --   --   PHOS 2.8 2.7 2.6 1.8* 3.1 2.3* 2.5 2.1*   Liver Function Tests: Recent Labs  Lab 03/09/18 1831 03/10/18 0408 03/12/18 0432 03/13/18 0324 03/15/18 0454 03/16/18 0541  AST 54* 21 23  --   --   --   ALT 36 23 16*  --   --   --   ALKPHOS 179* 134* 102  --   --   --   BILITOT 1.1 0.3 0.3  --   --   --   PROT 9.8* 8.0 6.4*  --   --   --   ALBUMIN 1.8* 1.6*  1.6* 1.2* 1.3* 1.3* 1.5*   No results for input(s): LIPASE, AMYLASE in the last 168 hours. No results for input(s): AMMONIA in the last 168 hours. CBC: Recent Labs  Lab 03/09/18 1831 03/10/18 0408 03/11/18 0524 03/13/18 0324 03/15/18 0454 03/15/18 1700 03/16/18 0541  WBC 22.6* 16.4* 14.1* 7.8 7.5  --  9.3  NEUTROABS 20.1* 13.6*  --  4.8 4.5  --  5.9  HGB 11.0* 9.0* 8.7* 7.7* 7.7* 10.6* 10.9*  HCT 36.1* 30.9* 29.3* 26.4* 26.8* 34.5* 35.9*  MCV 95.0 93.6 95.1 94.3 93.1  --  91.3  PLT PLATELETS APPEAR INCREASED 392 285 323 301  --  362   Cardiac Enzymes: No results for input(s): CKTOTAL, CKMB, CKMBINDEX, TROPONINI in the last 168 hours. BNP: BNP (last 3 results) No results for input(s): BNP in the last 8760 hours.  ProBNP (last 3 results) No results for input(s): PROBNP in the  last 8760 hours.  CBG: Recent Labs  Lab 03/15/18 1554 03/15/18 2008 03/15/18 2329 03/16/18 0357 03/16/18 0743  GLUCAP 95 94 95 110* 119*       Signed:  Rhetta Mura MD   [facility] 03/16/2018, 9:18 AM

## 2018-03-16 NOTE — Progress Notes (Signed)
Name: Joe Sandoval MRN: [id number] DOB: 1946/06/12    ADMISSION DATE:  03/09/2018 CONSULTATION DATE:  03/09/2018  REFERRING MD :  Dr. Clarene Duke  CHIEF COMPLAINT:  Hypotension/ VDRF  HISTORY OF PRESENT ILLNESS:   72 year old male with past medical history of ventilator dependent respiratory failure s/p tracheostomy, G-tube dependent, COPD, CVA with residual left hemiplegia, DM, MDRO including EBSL, MRSA, VRE, and seizures who is a resident at [facility] with state gold DNR form present.  Patient who was sent for fever, respiratory distress including agonal breathing onset today.  Patient recently finished course of vancomycin and cefepime 5 days ago.    On arrival, patient was in no respiratory distress on MV and neurologically at baseline.  Concern for sepsis given fever, tachycardia, and borderline hypotension based on measurements taken on left lower leg.  CXR showing RLL inflitrate, UTI noted, WBC 22.6, K 5.2, sCr 0.69, lactate 2.15 -> 3.1. Cultures sent.  He was started on meropenum and linezolid and started on 30 ml/kg fluid resusitation with improvement in blood pressure.  At this time, patient does not meet ICU criteria and PCCM consulted for ventilator management.    SUBJECTIVE:  Likely back to [facility] today.  tol some PS intermittently. No sig change over weekend.   VITAL SIGNS: Temp:  [97.7 F (36.5 C)-100 F (37.8 C)] 97.7 F (36.5 C) (04/01 0750) Pulse Rate:  [78-126] 107 (04/01 0800) Resp:  [15-27] 21 (04/01 0800) BP: (92-140)/(47-81) 107/49 (04/01 0800) SpO2:  [100 %] 100 % (04/01 0800) FiO2 (%):  [30 %] 30 % (04/01 0735) Weight:  [59.8 kg (131 lb 13.4 oz)] 59.8 kg (131 lb 13.4 oz) (04/01 0500)  PHYSICAL EXAMINATION: General: chronically ill appearing male, NAD  HEENT: mm moist, trach c/d  Pulmonary: resps even non labored on PS 12/5, Scattered rhonchi  Cardiac: Regular rate and rhythm Abdomen: Soft nontender Extremities: Contracted, right AKA. Warm and dry  Skin stage  IV decub to bilateral glutes, hip and sacrum Neuro: Awake, tracks but noninteractive   Recent Labs  Lab 03/13/18 0324 03/15/18 0454 03/16/18 0541  NA 143 139 137  K 4.1 4.0 3.8  CL 111 106 103  CO2 24 28 27   BUN 9 9 10   CREATININE 0.34* 0.39* 0.35*  GLUCOSE 104* 107* 106*   Recent Labs  Lab 03/13/18 0324 03/15/18 0454 03/15/18 1700 03/16/18 0541  HGB 7.7* 7.7* 10.6* 10.9*  HCT 26.4* 26.8* 34.5* 35.9*  WBC 7.8 7.5  --  9.3  PLT 323 301  --  362   Dg Chest Port 1 View  Result Date: 03/16/2018 CLINICAL DATA:  Recent pneumonia EXAM: PORTABLE CHEST 1 VIEW COMPARISON:  March 15, 2018 and March 14, 2018 FINDINGS: Tracheostomy catheter tip is 5.5 cm above the carina. Central catheter tip is in the superior vena cava. No pneumothorax. There is atelectatic change in the lung bases with small pleural effusions bilaterally. There is no appreciable airspace consolidation. Heart is upper normal in size with pulmonary vascularity within normal limits. No adenopathy. No bone lesions. IMPRESSION: Tube and catheter positions as described without pneumothorax. Small pleural effusions bilaterally with bibasilar atelectasis. Stable cardiac silhouette. No frank consolidation. Electronically Signed   By: Bretta Bang III M.D.   On: 03/16/2018 07:44   Dg Chest Port 1 View  Result Date: 03/15/2018 CLINICAL DATA:  Follow-up pneumonia. EXAM: PORTABLE CHEST 1 VIEW COMPARISON:  03/14/2018 FINDINGS: The right arm PICC line tip is in the projection of the distal SVC. There  is a tracheostomy tube with tip above the carina. Normal heart size. Diffuse lower lobe predominant coarsened interstitial opacities throughout both lungs are unchanged compared with previous exam. IMPRESSION: 1. Stable support apparatus. 2. No change in aeration to the lungs compared with previous exam. Electronically Signed   By: Signa Kellaylor  Stroud M.D.   On: 03/15/2018 07:25   Dg Chest Port 1 View  Result Date: 03/15/2018 CLINICAL DATA:   Pneumonia. EXAM: PORTABLE CHEST 1 VIEW COMPARISON:  Chest radiograph March 12, 2018 FINDINGS: Cardiac silhouette is mildly enlarged and unchanged. Mediastinal silhouette is nonsuspicious. RIGHT PICC distal tip projects in mid severe vena cava. Tracheostomy tube in situ. Small LEFT greater than RIGHT pleural effusions. Diffuse interstitial prominence, confluent in the lung bases. No pneumothorax. Osteopenia. IMPRESSION: Similar interstitial prominence confluent in the lung bases with small pleural effusions. No apparent change in life-support lines. Electronically Signed   By: Awilda Metroourtnay  Bloomer M.D.   On: 03/15/2018 03:04   SIGNIFICANT EVENTS  3/25 Admitted  STUDIES:  CXR 3/25 >> mild hyperinflation; patchy atelectasis or infiltrate at right lung base  CULTURES:  3/25 BC x2 >>NEG 3/25 UC >>neg 3/25 trach aspirate >>cancelled   ANTIBIOTICS: pta Vanc and cefepime 3/25 linezolid >> 3/25 meropenem >>  Lines/drains: (all from outside facility) DL R PICC Foley Shiley 6 trach XLT distal Gtube Ostomy  PIV x 1   BRIEF PATIENT DESCRIPTION:  [age] yoM, w/PMH of DNR, VDRF/ trach/ MDRO, Gtube dependent with recent treatment for HCAP sent from [facility] with fever, agonal ?on MV with respiratory distress, presented to ER in no distress, at baseline mental status with soft BP (taken on lower extremity), tachycardic, lactate of 2.15-> 3.1 with concern for Sepsis found to have RLL PNA, UTI, started on 7930ml/kg fluid bolus with improvement in BP to baseline and on LUE.    No significant changes ready to go back to [facility]  ASSESSMENT / PLAN:  Chronic respiratory failure with tracheostomy and VDRF HCAP- hx of MDS, s/p course of vanc and cefepime at [facility] Oral thrush  COPD  Plan/recommendation Continue full ventilator support with intermittent PS wean as pt tol but doubt will make any sig progress in terms of weaning  D 7 abx -- can likely d/c today  Back to [facility] when bed available   PCCM will  see intermittently. Please call if needed.     Dirk DressKaty Gardner Servantes, NP 03/16/2018  10:03 AM Pager: [phone] or [phone]

## 2018-03-16 NOTE — Progress Notes (Addendum)
03/16/18  1500 hrs: At 1500 hrs this evening, this RN entered the patient's room to assess an alarm that was ringing. The patient was found to be tachycardic in the 130s, and entirely apneic on the vent via trach on PS/CPAP mode. The patient appeared to be seizing (lip-smacking motions, eye rolling, general shaking). This RN immediately changed the patient's vent settings back [facility] 30%, 5 PEEP, Rate 22, and Vt 400. The patient had a small amount of emesis which the RN suctioned out. 2 mg Ativan was given via PEG. RT and MD were notified. The seizure activity lasted between 2 and 3 minutes, after which the patient's VS returned to baseline. No new orders at this time.

## 2018-03-17 DIAGNOSIS — A419 Sepsis, unspecified organism: Secondary | ICD-10-CM | POA: Diagnosis not present

## 2018-03-17 LAB — GLUCOSE, CAPILLARY
GLUCOSE-CAPILLARY: 102 mg/dL — AB (ref 65–99)
GLUCOSE-CAPILLARY: 102 mg/dL — AB (ref 65–99)
GLUCOSE-CAPILLARY: 103 mg/dL — AB (ref 65–99)
GLUCOSE-CAPILLARY: 104 mg/dL — AB (ref 65–99)
GLUCOSE-CAPILLARY: 109 mg/dL — AB (ref 65–99)
GLUCOSE-CAPILLARY: 96 mg/dL (ref 65–99)
Glucose-Capillary: 104 mg/dL — ABNORMAL HIGH (ref 65–99)

## 2018-03-17 LAB — PHENYTOIN LEVEL, TOTAL: Phenytoin Lvl: 2.9 ug/mL — ABNORMAL LOW (ref 10.0–20.0)

## 2018-03-17 MED ORDER — PHENYTOIN 125 MG/5ML PO SUSP
200.0000 mg | Freq: Every day | ORAL | Status: DC
  Administered 2018-03-18 – 2018-03-23 (×6): 200 mg
  Filled 2018-03-17 (×6): qty 8

## 2018-03-17 MED ORDER — POTASSIUM PHOSPHATES 15 MMOLE/5ML IV SOLN
40.0000 meq | Freq: Once | INTRAVENOUS | Status: AC
  Administered 2018-03-17: 40 meq via INTRAVENOUS
  Filled 2018-03-17: qty 9.09

## 2018-03-17 MED ORDER — PHENYTOIN 125 MG/5ML PO SUSP
100.0000 mg | Freq: Two times a day (BID) | ORAL | Status: DC
  Administered 2018-03-17 – 2018-03-23 (×12): 100 mg
  Filled 2018-03-17 (×17): qty 4

## 2018-03-17 MED ORDER — PHENYTOIN 125 MG/5ML PO SUSP
200.0000 mg | Freq: Three times a day (TID) | ORAL | Status: DC
  Administered 2018-03-17: 200 mg
  Filled 2018-03-17 (×2): qty 8

## 2018-03-17 NOTE — Progress Notes (Signed)
MEDICATION RELATED CONSULT NOTE - INITIAL   Pharmacy Consult for Phenytoin Indication: Seizures  Allergies  Allergen Reactions  . Iodine Other (See Comments)    Unknown reaction (listed on [facility] paperwork)  . Shellfish Allergy Other (See Comments)    Unknown reaction (listed on [facility] Paperwork    Patient Measurements: Height: 6' (182.9 cm) Weight: 127 lb 3.3 oz (57.7 kg) IBW/kg (Calculated) : 77.6 Vital Signs: Temp: 98.7 F (37.1 C) (04/02 1200) Temp Source: Axillary (04/02 1200) BP: 126/63 (04/02 1500) Pulse Rate: 118 (04/02 1500) Intake/Output from previous day: 04/01 0701 - 04/02 0700 In: 2017.5 [I.V.:250; NG/GT:1647.5] Out: 2775 [Urine:1715; Drains:870; Stool:190] Intake/Output from this shift: Total I/O In: 650 [I.V.:100; Other:50; NG/GT:500] Out: 866 [Urine:710; Stool:150; Blood:6]  Labs: Recent Labs    03/15/18 0454 03/15/18 1700 03/16/18 0541  WBC 7.5  --  9.3  HGB 7.7* 10.6* 10.9*  HCT 26.8* 34.5* 35.9*  PLT 301  --  362  CREATININE 0.39*  --  0.35*  PHOS 2.5  --  2.1*  ALBUMIN 1.3*  --  1.5*   Estimated Creatinine Clearance: 69.1 mL/min (A) (by C-G formula based on SCr of 0.35 mg/dL (L)).  Assessment: 72 year old male with PEG and history of seizures on Keppra and Phenytoin per tube. Pharmacy consulted this PM to help with Phenytoin dosing.   Phenytoin level this am was 2.9. Last albumin yesterday was 1.5.  Corrected Phenytoin level = 7.25 on 100 mg per tube every 8 hours.  No current seizure activity noted.  MD empirically increased to 200 mg per tube every 8 hours and consulted pharmacy to dose.  Due to phenytoin not being linear, will decrease to max of 400 mg/day (a total increase of 100 mg daily from prior dose) and plan to recheck level in 5 days.   Goal of Therapy:  Phenytoin level 10 to 20 mg/L  Plan:  Change Phenytoin to 100mg  QAM and QHS and 200mg  Q1400PM.  Plan to recheck Phenytoin level and Albumin level in 5 days (Sun 4/7 or Mon  4/8).   Joe Sandoval, PharmD, BCPS, BCCCP Clinical Pharmacist Clinical phone 03/17/2018 until 11PM [phone]- #[phone] After hours, please call [phone] 03/17/2018,4:08 PM

## 2018-03-17 NOTE — Progress Notes (Signed)
[facility] has planned DC for Thursday at which point patient can return to SNF  CSW will continue to follow- no alternate vent snf beds available at this time  Burna SisJenna H. Nelda Luckey, LCSW Clinical Social Worker [phone]

## 2018-03-17 NOTE — Progress Notes (Signed)
Hospitalist progress note   Joe Sandoval  ZOX:[id number] DOB: 1946-08-24 DOA: 03/09/2018 PCP: Townsend Roger, MD Specialists:    Brief Narrative:  72 year old male transferred from [facility] to [facility] 3/26 Prior history of present dependent respiratory failure status post trach, G-tube dependent, COPD, CVA with residual left hemiplegia, diabetes mellitus type 2, MDRO including ESBL MRSA VRE Sent over for fever respiratory distress and agonal breathing after having finished a recent course of antibiotics vancomycin and cefepime 5 days previously [2/16 had BC + for S hemolyticus and sputum cult=Pseudomonas, and proteus mirabilis] On admission borderline hypotensive given 1.5 L in the emergency room CXR right lower lobe infiltrate WBC 22 K5.2 creatinine 0.6 lactic acid went from 2.1-3.1 critical care saw patient changed to meropenem/ Linezolide-- placed on contact--also noted sacral and gluteal decubiti with oral pharyngeal thrush as well  Assessment & Plan:   Assessment:  The primary encounter diagnosis was Sepsis, due to unspecified organism ([place]). Diagnoses of HCAP (healthcare-associated pneumonia), Urinary tract infection without hematuria, site unspecified, Ventilator dependent ([place]), Pneumonia, and PNA (pneumonia) were also pertinent to this visit.   CVA with residual left hemiplegia and 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 -episodic seizures noted 4/1 ~15:00--I did not receive notification of this on my pager nor as a [facility] APP cell phone text--was treated with Ativan per Nursing notes -no further apparent Sz -continue Keppra 1.5 every 12, -Increase Dilantin to 200 every 8, corrected Dilantin level 03/17/18 was 5.7, will ask pharmacy to dose further -Monitor for further seizure activity overnight at least  Ventilator dependent respiratory failure, G-tube dependent with diverting ostomy-defer management to critical care- Chest x-ray 3/28 look like scarring--repeat 3/30 done shows improvement  to my assesement Vent asynchrony and discomfort managed by PCCM 3/30--given fentanyl -Pulmonology to adjust ventilator as needed--please re-consult them prn as have signed off  Severe sepsis- pneumonia-continue meropenem and Zyvox.   Wounds not reviewed recently-As per wound nurse note 3.26 left ischial right atrial left trochanter coccygeal left lateral foot and dorsum right hand Urine cultures NGTD blood culture no growth so far from 3/25 MRSA screen + saline lock 75 cc/h 3/31--unfortunately respiratory cult not done on admit WBC 22-->16-->14--> 9.3 For concern of oropharyngeal change diflucan to Nystatin 500,000 qid 3.29  transient hypotension 3/30 Related to Fentanyl dosing Resolved with saline bolus No further work-up--not related to sepsis  malfunctioning PEG-noted oozing around tube Flushed at bedside by RN 3/27and no leakage-feel safe to trial tube feeds and re-initiate-note allergy to Vital Adding free water flush 200 q8 Stable and improved now  probable dilutional anemia hemoglobin still 7.7--2 U prbc transfused 3/31--hemoglobin 10.9 03/16/18 with rechecking 4/2 Currently no concerns of dark or tarry stools at this time  Diabetes mellitus type 2-sliding scale coverage at this time-Sugars are 90-100 control seems in OP to be complicated by hypoglycemia--is on glucagon as OP  Hypothyroidism-continue Synthroid dose po resumed 3.29  AKI-on admit 72/0.6--currently 39/0.4--->resolving to 11/0.33 Last phosphorus 2.1 so replacing again--recheck magnesium in a.m.  Venous thrombosis history-continue Lovenox full dose   DVT prophylaxis: lovenox  Code Status:   partial   Family Communication:  Multiple calls made to family, cannot contact anyone- Disposition Plan: [facility] when bed avialable--nearing medical readiness for d/c   Consultants:   PCCM  Procedures:   cxr  Antimicrobials:   Merropenem and Zyvox   Subjective:  Agitated a little Back on PRVC responds  some to menance otherwise non-communicative  Objective: Vitals:   03/17/18 0700 03/17/18  2010 03/17/18 0753 03/17/18 0800  BP: (!) 104/50 (!) 104/50  (!) 119/53  Pulse: 95 (!) 117  (!) 114  Resp: 19 (!) 21  16  Temp:  97.9 F (36.6 C) 98.9 F (37.2 C)   TempSrc:  Axillary Oral   SpO2: 99%   96%  Weight:      Height:        Intake/Output Summary (Last 24 hours) at 03/17/2018 0811 Last data filed at 03/17/2018 0700 Gross per 24 hour  Intake 1957.5 ml  Output 2775 ml  Net -817.5 ml   Filed Weights   03/15/18 0500 03/16/18 0500 03/17/18 0326  Weight: 58.5 kg (128 lb 15.5 oz) 59.8 kg (131 lb 13.4 oz) 57.7 kg (127 lb 3.3 oz)    Examination:  Awake alert agitated to some degree eomi-occ smacking lip movements [not new] Trach in place s1 s 2no m PEG dressing and area looks clean Wound snot examined today [see my d/c summary for last pictures]   Data Reviewed: I have personally reviewed following labs and imaging studies  CBC: Recent Labs  Lab 03/11/18 0524 03/13/18 0324 03/15/18 0454 03/15/18 1700 03/16/18 0541  WBC 14.1* 7.8 7.5  --  9.3  NEUTROABS  --  4.8 4.5  --  5.9  HGB 8.7* 7.7* 7.7* 10.6* 10.9*  HCT 29.3* 26.4* 26.8* 34.5* 35.9*  MCV 95.1 94.3 93.1  --  91.3  PLT 285 323 301  --  071   Basic Metabolic Panel: Recent Labs  Lab 03/11/18 1857 03/12/18 0432 03/12/18 1743 03/13/18 0324 03/15/18 0454 03/16/18 0541  NA  --  148*  --  143 139 137  K  --  3.3*  --  4.1 4.0 3.8  CL  --  115*  --  111 106 103  CO2  --  26  --  24 28 27   GLUCOSE  --  131*  --  104* 107* 106*  BUN  --  11  --  9 9 10   CREATININE  --  0.33*  --  0.34* 0.39* 0.35*  CALCIUM  --  8.3*  --  8.1* 8.1* 8.3*  MG 1.7 1.6* 2.4 2.1  --   --   PHOS 2.6 1.8* 3.1 2.3* 2.5 2.1*   GFR: Estimated Creatinine Clearance: 69.1 mL/min (A) (by C-G formula based on SCr of 0.35 mg/dL (L)). Liver Function Tests: Recent Labs  Lab 03/12/18 0432 03/13/18 0324 03/15/18 0454 03/16/18 0541  AST 23   --   --   --   ALT 16*  --   --   --   ALKPHOS 102  --   --   --   BILITOT 0.3  --   --   --   PROT 6.4*  --   --   --   ALBUMIN 1.2* 1.3* 1.3* 1.5*   No results for input(s): LIPASE, AMYLASE in the last 168 hours. No results for input(s): AMMONIA in the last 168 hours. Coagulation Profile: No results for input(s): INR, PROTIME in the last 168 hours. Cardiac Enzymes: No results for input(s): CKTOTAL, CKMB, CKMBINDEX, TROPONINI in the last 168 hours. CBG: Recent Labs  Lab 03/16/18 1612 03/16/18 2047 03/16/18 2350 03/17/18 0415 03/17/18 0750  GLUCAP 91 111* 93 102* 104*   Urine analysis:    Component Value Date/Time   COLORURINE AMBER (A) 03/09/2018 1831   APPEARANCEUR TURBID (A) 03/09/2018 1831   LABSPEC 1.021 03/09/2018 1831   PHURINE 5.0 03/09/2018 1831  GLUCOSEU NEGATIVE 03/09/2018 1831   HGBUR MODERATE (A) 03/09/2018 1831   BILIRUBINUR NEGATIVE 03/09/2018 1831   KETONESUR NEGATIVE 03/09/2018 1831   PROTEINUR 100 (A) 03/09/2018 1831   NITRITE NEGATIVE 03/09/2018 1831   LEUKOCYTESUR LARGE (A) 03/09/2018 1831     Radiology Studies: Reviewed images personally in health database    Scheduled Meds: . chlorhexidine gluconate (MEDLINE KIT)  15 mL Mouth Rinse BID  . Chlorhexidine Gluconate Cloth  6 each Topical Daily  . collagenase   Topical Daily  . enoxaparin (LOVENOX) injection  60 mg Subcutaneous Q12H  . folic acid  5 mg Oral Daily  . free water  200 mL Per Tube Q8H  . insulin aspart  0-9 Units Subcutaneous Q4H  . ipratropium-albuterol  3 mL Nebulization BID  . levETIRAcetam  1,500 mg Per Tube Q12H  . levothyroxine  50 mcg Per Tube QAC breakfast  . mouth rinse  15 mL Mouth Rinse 10 times per day  . metoCLOPramide (REGLAN) injection  5 mg Intravenous Q8H  . metoprolol tartrate  50 mg Per Tube BID  . nystatin  5 mL Oral QID  . pantoprazole sodium  40 mg Per Tube QHS  . phenytoin  100 mg Per Tube Q8H  . QUEtiapine  25 mg Per Tube BID  . sodium chloride flush   10-40 mL Intracatheter Q12H   Continuous Infusions: . feeding supplement (PIVOT 1.5 CAL) 1,000 mL (03/17/18 0450)     LOS: 8 days    Time spent: [place], MD [facility] (P) [phone]   If 7PM-7AM, please contact night-coverage [email or web address] Password [email or web address] 03/17/2018, 8:11 AM

## 2018-03-18 DIAGNOSIS — R569 Unspecified convulsions: Secondary | ICD-10-CM | POA: Diagnosis not present

## 2018-03-18 DIAGNOSIS — L89159 Pressure ulcer of sacral region, unspecified stage: Secondary | ICD-10-CM | POA: Diagnosis not present

## 2018-03-18 DIAGNOSIS — N390 Urinary tract infection, site not specified: Secondary | ICD-10-CM | POA: Diagnosis not present

## 2018-03-18 DIAGNOSIS — Z9911 Dependence on respirator [ventilator] status: Secondary | ICD-10-CM | POA: Diagnosis not present

## 2018-03-18 DIAGNOSIS — J9601 Acute respiratory failure with hypoxia: Secondary | ICD-10-CM | POA: Diagnosis not present

## 2018-03-18 DIAGNOSIS — J9611 Chronic respiratory failure with hypoxia: Secondary | ICD-10-CM | POA: Diagnosis not present

## 2018-03-18 DIAGNOSIS — A419 Sepsis, unspecified organism: Secondary | ICD-10-CM | POA: Diagnosis not present

## 2018-03-18 LAB — CBC WITH DIFFERENTIAL/PLATELET
BASOS PCT: 0 %
Basophils Absolute: 0 10*3/uL (ref 0.0–0.1)
Eosinophils Absolute: 0.7 10*3/uL (ref 0.0–0.7)
Eosinophils Relative: 8 %
HEMATOCRIT: 34.6 % — AB (ref 39.0–52.0)
HEMOGLOBIN: 10.5 g/dL — AB (ref 13.0–17.0)
Lymphocytes Relative: 20 %
Lymphs Abs: 1.7 10*3/uL (ref 0.7–4.0)
MCH: 27.6 pg (ref 26.0–34.0)
MCHC: 30.3 g/dL (ref 30.0–36.0)
MCV: 91.1 fL (ref 78.0–100.0)
MONO ABS: 1 10*3/uL (ref 0.1–1.0)
Monocytes Relative: 11 %
NEUTROS ABS: 5.2 10*3/uL (ref 1.7–7.7)
NEUTROS PCT: 61 %
Platelets: 296 10*3/uL (ref 150–400)
RBC: 3.8 MIL/uL — ABNORMAL LOW (ref 4.22–5.81)
RDW: 16.4 % — AB (ref 11.5–15.5)
WBC: 8.6 10*3/uL (ref 4.0–10.5)

## 2018-03-18 LAB — RENAL FUNCTION PANEL
ALBUMIN: 1.5 g/dL — AB (ref 3.5–5.0)
ANION GAP: 10 (ref 5–15)
BUN: 14 mg/dL (ref 6–20)
CO2: 27 mmol/L (ref 22–32)
Calcium: 8.3 mg/dL — ABNORMAL LOW (ref 8.9–10.3)
Chloride: 100 mmol/L — ABNORMAL LOW (ref 101–111)
Creatinine, Ser: 0.34 mg/dL — ABNORMAL LOW (ref 0.61–1.24)
GFR calc Af Amer: >60 mL/min (ref >60)
Glucose, Bld: 118 mg/dL — ABNORMAL HIGH (ref 65–99)
PHOSPHORUS: 3.6 mg/dL (ref 2.5–4.6)
POTASSIUM: 4.2 mmol/L (ref 3.5–5.1)
Sodium: 137 mmol/L (ref 135–145)

## 2018-03-18 LAB — GLUCOSE, CAPILLARY
GLUCOSE-CAPILLARY: 122 mg/dL — AB (ref 65–99)
GLUCOSE-CAPILLARY: 114 mg/dL — AB (ref 65–99)
GLUCOSE-CAPILLARY: 87 mg/dL (ref 65–99)
GLUCOSE-CAPILLARY: 99 mg/dL (ref 65–99)
Glucose-Capillary: 109 mg/dL — ABNORMAL HIGH (ref 65–99)
Glucose-Capillary: 85 mg/dL (ref 65–99)

## 2018-03-18 LAB — MAGNESIUM: MAGNESIUM: 1.8 mg/dL (ref 1.7–2.4)

## 2018-03-18 NOTE — Progress Notes (Signed)
Name: Deanna ArtisJames Capitano MRN: [id number] DOB: 05/03/1946    ADMISSION DATE:  03/09/2018 CONSULTATION DATE:  03/09/2018  REFERRING MD :  Dr. Clarene DukeLittle  CHIEF COMPLAINT:  Hypotension/ VDRF  HISTORY OF PRESENT ILLNESS:   72 year old male with past medical history of ventilator dependent respiratory failure s/p tracheostomy, G-tube dependent, COPD, CVA with residual left hemiplegia, DM, MDRO including EBSL, MRSA, VRE, and seizures who is a resident at [facility] with state gold DNR form present.  Patient who was sent for fever, respiratory distress including agonal breathing onset today.  Patient recently finished course of vancomycin and cefepime 5 days ago.    On arrival, patient was in no respiratory distress on MV and neurologically at baseline.  Concern for sepsis given fever, tachycardia, and borderline hypotension based on measurements taken on left lower leg.  CXR showing RLL inflitrate, UTI noted, WBC 22.6, K 5.2, sCr 0.69, lactate 2.15 -> 3.1. Cultures sent.  He was started on meropenum and linezolid and started on 30 ml/kg fluid resusitation with improvement in blood pressure.  At this time, patient does not meet ICU criteria and PCCM consulted for ventilator management.    SUBJECTIVE:  Plan for back to [facility] in am  Mod secretions Weaning PS 10-12/5  Otherwise no change   VITAL SIGNS: Temp:  [98.7 F (37.1 C)-99.8 F (37.7 C)] 99.2 F (37.3 C) (04/03 0754) Pulse Rate:  [80-124] 124 (04/03 0800) Resp:  [14-25] 20 (04/03 0800) BP: (94-153)/(49-72) 126/61 (04/03 0800) SpO2:  [95 %-100 %] 98 % (04/03 0800) FiO2 (%):  [30 %] 30 % (04/03 0756) Weight:  [58.2 kg (128 lb 4.9 oz)] 58.2 kg (128 lb 4.9 oz) (04/03 0406)  PHYSICAL EXAMINATION: General: chronically ill appearing male, NAD on vent  HEENT: mm moist, trach site clean Pulmonary: resps even non labored on PS 10/5, coarse Cardiac: Regular rate and rhythm Abdomen: Soft nontender Extremities: Contracted, right AKA. Warm and dry    Skin stage IV decub to bilateral glutes, hip and sacrum Neuro: Awake, eyes open, tracks but noninteractive   Recent Labs  Lab 03/15/18 0454 03/16/18 0541 03/18/18 0419  NA 139 137 137  K 4.0 3.8 4.2  CL 106 103 100*  CO2 28 27 27   BUN 9 10 14   CREATININE 0.39* 0.35* 0.34*  GLUCOSE 107* 106* 118*   Recent Labs  Lab 03/15/18 0454 03/15/18 1700 03/16/18 0541 03/18/18 0420  HGB 7.7* 10.6* 10.9* 10.5*  HCT 26.8* 34.5* 35.9* 34.6*  WBC 7.5  --  9.3 8.6  PLT 301  --  362 296   No results found. SIGNIFICANT EVENTS  3/25 Admitted  STUDIES:  CXR 3/25 >> mild hyperinflation; patchy atelectasis or infiltrate at right lung base  CULTURES:  3/25 BC x2 >>NEG 3/25 UC >>neg 3/25 trach aspirate >>cancelled   ANTIBIOTICS: pta Vanc and cefepime 3/25 linezolid >>4/2 3/25 meropenem >>4/2  Lines/drains: (all from outside facility) DL R PICC Foley Shiley 6 trach XLT distal Gtube Ostomy  PIV x 1   BRIEF PATIENT DESCRIPTION:  [age] yoM, w/PMH of DNR, VDRF/ trach/ MDRO, Gtube dependent with recent treatment for HCAP sent from [facility] with fever, agonal ?on MV with respiratory distress, presented to ER in no distress, at baseline mental status with soft BP (taken on lower extremity), tachycardic, lactate of 2.15-> 3.1 with concern for Sepsis found to have RLL PNA, UTI, started on 6330ml/kg fluid bolus with improvement in BP to baseline and on LUE.    No significant changes  ready to go back to [facility].   ASSESSMENT / PLAN:  Chronic respiratory failure with tracheostomy and VDRF HCAP- hx of MDS, s/p course of vanc and cefepime at [facility] COPD  Plan/recommendation Continue full ventilator support with intermittent PS wean as pt tol but doubt will make any significant progress in terms of weaning - would not try ATC at this time Monitor off abx - s/p 7 day course  Back to [facility] when bed available - likely 4/4  PCCM will see intermittently while he remains in hospital.    Dirk Dress, NP 03/18/2018  8:55 AM Pager: [phone] or [phone]

## 2018-03-18 NOTE — Progress Notes (Signed)
PROGRESS NOTE    Joe Sandoval   YSA:[id number]  DOB: [birth year]  DOA: 03/09/2018 PCP: Townsend Roger, MD   Brief Narrative:  Joe Sandoval  is a complicated 72 y.o. male with history of seizures, ventilator dependent respiratory failure, tracheostomy PEG tube placement colostomy, history of ESBL infection, COPD, CVA with right-sided weakness, right lower extremity amputation multiple sacral decubitus ulcers was brought to the ER after patient was found to be increasingly febrile with respiratory distress and hypoxia.  Had finished a recent course of antibiotics vancomycin and cefepime 5 days previously [2/16 had BC + for S hemolyticus and sputum cult=Pseudomonas, and proteus mirabilis]   Subjective:  non- communicative on vent    Assessment & Plan:   Principal Problem:   Sepsis ([place])   Acute respiratory failure with hypoxia ([place])   HCAP (healthcare-associated pneumonia)   Ventilator dependent ([place]) - completed a 7 day course of Zovox and Meropenem on 3/31 - PCCM following    Seizure on 4/1 H/o CVA with residual left hemiplegia and 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 -episodic seizures noted 4/1 ~15:00--I did not receive notification of this on my pager nor as a [facility] APP cell phone text--was treated with Ativan per Nursing notes -no further apparent Sz -continue Keppra 1.5 every 12 per home dose -Increased Dilantin from 100 Q8 to  to 200 every 8, corrected Dilantin level 03/17/18 was 5.7-  pharmacy to dosing now  PEG tube  - cont tube feeds and free water    Decubitus ulcers - multiple on sacrum/ buttocks - cont wound care  DM2 - Q 4 SSI  HTN  - Lopressor- BP stable    DVT prophylaxis: Lovenox Code Status: partial Family Communication: apparenlty muliple called were made but no on reached Disposition Plan: [facility] when bed available Consultants:   PCCM Procedures:     Antimicrobials:  Anti-infectives (From admission, onward)   Start     Dose/Rate Route  Frequency Ordered Stop   03/12/18 1000  fluconazole (DIFLUCAN) IVPB 100 mg  Status:  Discontinued     100 mg 50 mL/hr over 60 Minutes Intravenous Every 24 hours 03/11/18 0912 03/12/18 1412   03/11/18 1000  fluconazole (DIFLUCAN) IVPB 200 mg     200 mg 100 mL/hr over 60 Minutes Intravenous  Once 03/11/18 0912 03/11/18 1121   03/10/18 0400  meropenem (MERREM) 1 g in sodium chloride 0.9 % 100 mL IVPB     1 g 200 mL/hr over 30 Minutes Intravenous Every 8 hours 03/09/18 1937 03/15/18 2021   03/09/18 2000  linezolid (ZYVOX) IVPB 600 mg     600 mg 300 mL/hr over 60 Minutes Intravenous Every 12 hours 03/09/18 1937 03/15/18 2317   03/09/18 1945  meropenem (MERREM) 1 g in sodium chloride 0.9 % 100 mL IVPB     1 g 200 mL/hr over 30 Minutes Intravenous  Once 03/09/18 1941 03/09/18 2053   03/09/18 1900  vancomycin (VANCOCIN) IVPB 750 mg/150 ml premix  Status:  Discontinued     750 mg 150 mL/hr over 60 Minutes Intravenous  Once 03/09/18 1859 03/09/18 1907   03/09/18 1815  piperacillin-tazobactam (ZOSYN) IVPB 3.375 g     3.375 g 100 mL/hr over 30 Minutes Intravenous  Once 03/09/18 1811 03/09/18 1908       Objective: Vitals:   03/18/18 1528 03/18/18 1600 03/18/18 1616 03/18/18 1700  BP: 114/62 (!) 102/57    Pulse: (!) 104 (!) 103  100  Resp: 19 19  18  Temp:   98.8 F (37.1 C)   TempSrc:   Oral   SpO2: 97% 98%  99%  Weight:      Height:        Intake/Output Summary (Last 24 hours) at 03/18/2018 1748 Last data filed at 03/18/2018 1700 Gross per 24 hour  Intake 1945.75 ml  Output 3140 ml  Net -1194.25 ml   Filed Weights   03/16/18 0500 03/17/18 0326 03/18/18 0406  Weight: 59.8 kg (131 lb 13.4 oz) 57.7 kg (127 lb 3.3 oz) 58.2 kg (128 lb 4.9 oz)    Examination: General exam: Appears comfortable  HEENT: trach in place Respiratory system: Clear to auscultation.   Cardiovascular system: S1 & S2 heard, RRR.   Gastrointestinal system: Abdomen soft, PEG intact Central nervous system:  Alert   Extremities: No cyanosis, clubbing or edema Skin:  Multiple sacral decubitus ulcers with clean bases    Data Reviewed: I have personally reviewed following labs and imaging studies  CBC: Recent Labs  Lab 03/13/18 0324 03/15/18 0454 03/15/18 1700 03/16/18 0541 03/18/18 0420  WBC 7.8 7.5  --  9.3 8.6  NEUTROABS 4.8 4.5  --  5.9 5.2  HGB 7.7* 7.7* 10.6* 10.9* 10.5*  HCT 26.4* 26.8* 34.5* 35.9* 34.6*  MCV 94.3 93.1  --  91.3 91.1  PLT 323 301  --  362 438   Basic Metabolic Panel: Recent Labs  Lab 03/11/18 1857 03/12/18 0432 03/12/18 1743 03/13/18 0324 03/15/18 0454 03/16/18 0541 03/18/18 0419 03/18/18 0420  NA  --  148*  --  143 139 137 137  --   K  --  3.3*  --  4.1 4.0 3.8 4.2  --   CL  --  115*  --  111 106 103 100*  --   CO2  --  26  --  24 28 27 27   --   GLUCOSE  --  131*  --  104* 107* 106* 118*  --   BUN  --  11  --  9 9 10 14   --   CREATININE  --  0.33*  --  0.34* 0.39* 0.35* 0.34*  --   CALCIUM  --  8.3*  --  8.1* 8.1* 8.3* 8.3*  --   MG 1.7 1.6* 2.4 2.1  --   --   --  1.8  PHOS 2.6 1.8* 3.1 2.3* 2.5 2.1* 3.6  --    GFR: Estimated Creatinine Clearance: 69.7 mL/min (A) (by C-G formula based on SCr of 0.34 mg/dL (L)). Liver Function Tests: Recent Labs  Lab 03/12/18 0432 03/13/18 0324 03/15/18 0454 03/16/18 0541 03/18/18 0419  AST 23  --   --   --   --   ALT 16*  --   --   --   --   ALKPHOS 102  --   --   --   --   BILITOT 0.3  --   --   --   --   PROT 6.4*  --   --   --   --   ALBUMIN 1.2* 1.3* 1.3* 1.5* 1.5*   No results for input(s): LIPASE, AMYLASE in the last 168 hours. No results for input(s): AMMONIA in the last 168 hours. Coagulation Profile: No results for input(s): INR, PROTIME in the last 168 hours. Cardiac Enzymes: No results for input(s): CKTOTAL, CKMB, CKMBINDEX, TROPONINI in the last 168 hours. BNP (last 3 results) No results for input(s): PROBNP in the last 8760 hours. HbA1C: No  results for input(s): HGBA1C in the last  72 hours. CBG: Recent Labs  Lab 03/17/18 2332 03/18/18 0341 03/18/18 0751 03/18/18 1201 03/18/18 1613  GLUCAP 104* 122* 109* 114* 87   Lipid Profile: No results for input(s): CHOL, HDL, LDLCALC, TRIG, CHOLHDL, LDLDIRECT in the last 72 hours. Thyroid Function Tests: No results for input(s): TSH, T4TOTAL, FREET4, T3FREE, THYROIDAB in the last 72 hours. Anemia Panel: No results for input(s): VITAMINB12, FOLATE, FERRITIN, TIBC, IRON, RETICCTPCT in the last 72 hours. Urine analysis:    Component Value Date/Time   COLORURINE AMBER (A) 03/09/2018 1831   APPEARANCEUR TURBID (A) 03/09/2018 1831   LABSPEC 1.021 03/09/2018 1831   PHURINE 5.0 03/09/2018 1831   GLUCOSEU NEGATIVE 03/09/2018 1831   HGBUR MODERATE (A) 03/09/2018 1831   BILIRUBINUR NEGATIVE 03/09/2018 1831   KETONESUR NEGATIVE 03/09/2018 1831   PROTEINUR 100 (A) 03/09/2018 1831   NITRITE NEGATIVE 03/09/2018 1831   LEUKOCYTESUR LARGE (A) 03/09/2018 1831   Sepsis Labs: @LABRCNTIP (procalcitonin:4,lacticidven:4) ) Recent Results (from the past 240 hour(s))  Blood Culture (routine x 2)     Status: None   Collection Time: 03/09/18  6:31 PM  Result Value Ref Range Status   Specimen Description BLOOD SITE NOT SPECIFIED  Final   Special Requests IN PEDIATRIC BOTTLE Blood Culture adequate volume  Final   Culture   Final    NO GROWTH 5 DAYS Performed at [facility], [place] [address], [place], [place] [address]    Report Status 03/14/2018 FINAL  Final  Culture, blood (single)     Status: None   Collection Time: 03/09/18  7:16 PM  Result Value Ref Range Status   Specimen Description BLOOD RIGHT PICC LINE  Final   Special Requests   Final    BOTTLES DRAWN AEROBIC AND ANAEROBIC Blood Culture adequate volume   Culture   Final    NO GROWTH 5 DAYS Performed at [facility], [place] [address] [place], [place] [address]    Report Status 03/14/2018 FINAL  Final  Urine culture     Status: None   Collection Time: 03/09/18   7:59 PM  Result Value Ref Range Status   Specimen Description URINE, CATHETERIZED  Final   Special Requests NONE  Final   Culture   Final    NO GROWTH Performed at [facility], [place] [address], [place], [place] [address]    Report Status 03/11/2018 FINAL  Final  Blood Culture (routine x 2)     Status: None   Collection Time: 03/09/18  8:18 PM  Result Value Ref Range Status   Specimen Description BLOOD RIGHT WRIST  Final   Special Requests IN PEDIATRIC BOTTLE Blood Culture adequate volume  Final   Culture   Final    NO GROWTH 5 DAYS Performed at [facility], [place] [address] [place], [place] [address]    Report Status 03/14/2018 FINAL  Final  MRSA PCR Screening     Status: Abnormal   Collection Time: 03/10/18  4:33 PM  Result Value Ref Range Status   MRSA by PCR POSITIVE (A) NEGATIVE Final    Comment:        The GeneXpert MRSA Assay (FDA approved for NASAL specimens only), is one component of a comprehensive MRSA colonization surveillance program. It is not intended to diagnose MRSA infection nor to guide or monitor treatment for MRSA infections. RESULT CALLED TO, READ BACK BY AND VERIFIED WITH: A.LOWE,RN AT 0014 BY L.PITT 03/11/18  Radiology Studies: No results found.    Scheduled Meds: . chlorhexidine gluconate (MEDLINE KIT)  15 mL Mouth Rinse BID  . Chlorhexidine Gluconate Cloth  6 each Topical Daily  . collagenase   Topical Daily  . enoxaparin (LOVENOX) injection  60 mg Subcutaneous Q12H  . folic acid  5 mg Oral Daily  . free water  200 mL Per Tube Q8H  . insulin aspart  0-9 Units Subcutaneous Q4H  . ipratropium-albuterol  3 mL Nebulization BID  . levETIRAcetam  1,500 mg Per Tube Q12H  . levothyroxine  50 mcg Per Tube QAC breakfast  . mouth rinse  15 mL Mouth Rinse 10 times per day  . metoCLOPramide (REGLAN) injection  5 mg Intravenous Q8H  . metoprolol tartrate  50 mg Per Tube BID  . nystatin  5 mL Oral QID  . pantoprazole sodium   40 mg Per Tube QHS  . phenytoin  100 mg Per Tube BID   And  . phenytoin  200 mg Per Tube Q1400  . QUEtiapine  25 mg Per Tube BID  . sodium chloride flush  10-40 mL Intracatheter Q12H   Continuous Infusions: . feeding supplement (PIVOT 1.5 CAL) 50 mL/hr at 03/18/18 1620     LOS: 9 days    Time spent in minutes: 35    Debbe Odea, MD [facility] Pager: [email or web address] Password [facility] 03/18/2018, 5:48 PM

## 2018-03-19 DIAGNOSIS — J9611 Chronic respiratory failure with hypoxia: Secondary | ICD-10-CM | POA: Diagnosis not present

## 2018-03-19 LAB — GLUCOSE, CAPILLARY
GLUCOSE-CAPILLARY: 122 mg/dL — AB (ref 65–99)
GLUCOSE-CAPILLARY: 100 mg/dL — AB (ref 65–99)
GLUCOSE-CAPILLARY: 132 mg/dL — AB (ref 65–99)
GLUCOSE-CAPILLARY: 123 mg/dL — AB (ref 65–99)
Glucose-Capillary: 123 mg/dL — ABNORMAL HIGH (ref 65–99)
Glucose-Capillary: 125 mg/dL — ABNORMAL HIGH (ref 65–99)

## 2018-03-19 LAB — PHENYTOIN LEVEL, TOTAL: Phenytoin Lvl: 4.1 ug/mL — ABNORMAL LOW (ref 10.0–20.0)

## 2018-03-19 LAB — ALBUMIN: Albumin: 1.6 g/dL — ABNORMAL LOW (ref 3.5–5.0)

## 2018-03-19 LAB — LEVETIRACETAM LEVEL: LEVETIRACETAM: 24.5 ug/mL (ref 10.0–40.0)

## 2018-03-19 NOTE — Progress Notes (Addendum)
3:45pm [facility] now unsure about DC tomorrow- awaiting final word from MD.  Will have bed by Monday at the lastest but will follow up with CSW in am regarding possible readmission tomorrow  9am CSW informed by [facility] that planned DC for today had to be canceled due to medical concerns- they are planning on DCing another patient tomorrow which would allow them to accept patient  CSW continuing to look into alternative vent SNFs in case patient cannot DC back to [facility] tomorrow  CSW will continue to follow  Burna SisJenna H. Sefora Tietje, LCSW Clinical Social Worker [phone]

## 2018-03-19 NOTE — Progress Notes (Signed)
PROGRESS NOTE    Joe Sandoval   HUT:[id number]  DOB: [birth year]  DOA: 03/09/2018 PCP: Townsend Roger, MD   Brief Narrative:  Joe Sandoval  is a complicated 72 y.o. male with history of seizures, ventilator dependent respiratory failure, tracheostomy PEG tube placement colostomy, history of ESBL infection, COPD, CVA with right-sided weakness, right lower extremity amputation multiple sacral decubitus ulcers was brought to the ER after patient was found to be increasingly febrile with respiratory distress and hypoxia.  Had finished a recent course of antibiotics vancomycin and cefepime 5 days previously [2/16 had BC + for S hemolyticus and sputum cult=Pseudomonas, and proteus mirabilis]   Subjective:  non- communicative  - sleeping but awakens easily - spoke with RN-  there have been no issues overnigh    Assessment & Plan:   Principal Problem:   Sepsis ([place])   Acute respiratory failure with hypoxia ([place])   HCAP (healthcare-associated pneumonia)   Ventilator dependent ([place]) - completed a 7 day course of Zovox and Meropenem on 3/31 - PCCM following    Seizure on 4/1 H/o CVA with residual left hemiplegia and 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 -episodic seizures noted 4/1 ~15:00-  -no further apparent Sz -continue Keppra 1.5 every 12 per home dose -Increased Dilantin from 100 Q8 to  to 200 every 8, corrected Dilantin level 03/17/18 was 5.7-  pharmacy dosing now- appreciate it  PEG tube, chronic foley, ileostomy - cont tube feeds and free water- follow I and O and keep even    Decubitus ulcers - multiple on sacrum/ buttocks - cont wound care  DM2 - Q 4 SSI  HTN  - Lopressor- BP stable       DVT prophylaxis: Lovenox Code Status: partial Family Communication: apparenlty muliple called were made but no on reached Disposition Plan: [facility] when bed available Consultants:   PCCM Procedures:     Antimicrobials:  Anti-infectives (From admission, onward)   Start      Dose/Rate Route Frequency Ordered Stop   03/12/18 1000  fluconazole (DIFLUCAN) IVPB 100 mg  Status:  Discontinued     100 mg 50 mL/hr over 60 Minutes Intravenous Every 24 hours 03/11/18 0912 03/12/18 1412   03/11/18 1000  fluconazole (DIFLUCAN) IVPB 200 mg     200 mg 100 mL/hr over 60 Minutes Intravenous  Once 03/11/18 0912 03/11/18 1121   03/10/18 0400  meropenem (MERREM) 1 g in sodium chloride 0.9 % 100 mL IVPB     1 g 200 mL/hr over 30 Minutes Intravenous Every 8 hours 03/09/18 1937 03/15/18 2021   03/09/18 2000  linezolid (ZYVOX) IVPB 600 mg     600 mg 300 mL/hr over 60 Minutes Intravenous Every 12 hours 03/09/18 1937 03/15/18 2317   03/09/18 1945  meropenem (MERREM) 1 g in sodium chloride 0.9 % 100 mL IVPB     1 g 200 mL/hr over 30 Minutes Intravenous  Once 03/09/18 1941 03/09/18 2053   03/09/18 1900  vancomycin (VANCOCIN) IVPB 750 mg/150 ml premix  Status:  Discontinued     750 mg 150 mL/hr over 60 Minutes Intravenous  Once 03/09/18 1859 03/09/18 1907   03/09/18 1815  piperacillin-tazobactam (ZOSYN) IVPB 3.375 g     3.375 g 100 mL/hr over 30 Minutes Intravenous  Once 03/09/18 1811 03/09/18 1908       Objective: Vitals:   03/19/18 1200 03/19/18 1300 03/19/18 1343 03/19/18 1400  BP: (!) 94/51 117/68  115/67  Pulse: (!) 103 (!) 106  Marland Kitchen)  112  Resp: (!) 24 19  (!) 23  Temp:      TempSrc:      SpO2: 97% 96% 99% 100%  Weight:      Height:        Intake/Output Summary (Last 24 hours) at 03/19/2018 1503 Last data filed at 03/19/2018 1400 Gross per 24 hour  Intake 1103.33 ml  Output 1515 ml  Net -411.67 ml   Filed Weights   03/17/18 0326 03/18/18 0406 03/19/18 0450  Weight: 57.7 kg (127 lb 3.3 oz) 58.2 kg (128 lb 4.9 oz) 58.1 kg (128 lb 1.4 oz)    Examination: General exam: Appears comfortable on vent HEENT: trach in place Respiratory system: Clear to auscultation.   Cardiovascular system: S1 & S2 heard, RRR.   Gastrointestinal system: Abdomen soft, PEG intact,    Ileostomy noted  Central nervous system: Alert   Extremities: No cyanosis, clubbing or edema Skin:  Multiple sacral decubitus ulcers with clean bases    Data Reviewed: I have personally reviewed following labs and imaging studies  CBC: Recent Labs  Lab 03/13/18 0324 03/15/18 0454 03/15/18 1700 03/16/18 0541 03/18/18 0420  WBC 7.8 7.5  --  9.3 8.6  NEUTROABS 4.8 4.5  --  5.9 5.2  HGB 7.7* 7.7* 10.6* 10.9* 10.5*  HCT 26.4* 26.8* 34.5* 35.9* 34.6*  MCV 94.3 93.1  --  91.3 91.1  PLT 323 301  --  362 711   Basic Metabolic Panel: Recent Labs  Lab 03/12/18 1743 03/13/18 0324 03/15/18 0454 03/16/18 0541 03/18/18 0419 03/18/18 0420  NA  --  143 139 137 137  --   K  --  4.1 4.0 3.8 4.2  --   CL  --  111 106 103 100*  --   CO2  --  24 28 27 27   --   GLUCOSE  --  104* 107* 106* 118*  --   BUN  --  9 9 10 14   --   CREATININE  --  0.34* 0.39* 0.35* 0.34*  --   CALCIUM  --  8.1* 8.1* 8.3* 8.3*  --   MG 2.4 2.1  --   --   --  1.8  PHOS 3.1 2.3* 2.5 2.1* 3.6  --    GFR: Estimated Creatinine Clearance: 69.6 mL/min (A) (by C-G formula based on SCr of 0.34 mg/dL (L)). Liver Function Tests: Recent Labs  Lab 03/13/18 0324 03/15/18 0454 03/16/18 0541 03/18/18 0419 03/19/18 0537  ALBUMIN 1.3* 1.3* 1.5* 1.5* 1.6*   No results for input(s): LIPASE, AMYLASE in the last 168 hours. No results for input(s): AMMONIA in the last 168 hours. Coagulation Profile: No results for input(s): INR, PROTIME in the last 168 hours. Cardiac Enzymes: No results for input(s): CKTOTAL, CKMB, CKMBINDEX, TROPONINI in the last 168 hours. BNP (last 3 results) No results for input(s): PROBNP in the last 8760 hours. HbA1C: No results for input(s): HGBA1C in the last 72 hours. CBG: Recent Labs  Lab 03/18/18 1926 03/18/18 2340 03/19/18 0332 03/19/18 0741 03/19/18 1133  GLUCAP 99 85 122* 100* 132*   Lipid Profile: No results for input(s): CHOL, HDL, LDLCALC, TRIG, CHOLHDL, LDLDIRECT in the last  72 hours. Thyroid Function Tests: No results for input(s): TSH, T4TOTAL, FREET4, T3FREE, THYROIDAB in the last 72 hours. Anemia Panel: No results for input(s): VITAMINB12, FOLATE, FERRITIN, TIBC, IRON, RETICCTPCT in the last 72 hours. Urine analysis:    Component Value Date/Time   COLORURINE AMBER (A) 03/09/2018 1831  APPEARANCEUR TURBID (A) 03/09/2018 1831   LABSPEC 1.021 03/09/2018 1831   PHURINE 5.0 03/09/2018 1831   GLUCOSEU NEGATIVE 03/09/2018 1831   HGBUR MODERATE (A) 03/09/2018 1831   BILIRUBINUR NEGATIVE 03/09/2018 1831   KETONESUR NEGATIVE 03/09/2018 1831   PROTEINUR 100 (A) 03/09/2018 1831   NITRITE NEGATIVE 03/09/2018 1831   LEUKOCYTESUR LARGE (A) 03/09/2018 1831   Sepsis Labs: @LABRCNTIP (procalcitonin:4,lacticidven:4) ) Recent Results (from the past 240 hour(s))  Blood Culture (routine x 2)     Status: None   Collection Time: 03/09/18  6:31 PM  Result Value Ref Range Status   Specimen Description BLOOD SITE NOT SPECIFIED  Final   Special Requests IN PEDIATRIC BOTTLE Blood Culture adequate volume  Final   Culture   Final    NO GROWTH 5 DAYS Performed at [facility], [place] [address] [place], [place] [address]    Report Status 03/14/2018 FINAL  Final  Culture, blood (single)     Status: None   Collection Time: 03/09/18  7:16 PM  Result Value Ref Range Status   Specimen Description BLOOD RIGHT PICC LINE  Final   Special Requests   Final    BOTTLES DRAWN AEROBIC AND ANAEROBIC Blood Culture adequate volume   Culture   Final    NO GROWTH 5 DAYS Performed at [facility], [place] [address] [place], [place] [address]    Report Status 03/14/2018 FINAL  Final  Urine culture     Status: None   Collection Time: 03/09/18  7:59 PM  Result Value Ref Range Status   Specimen Description URINE, CATHETERIZED  Final   Special Requests NONE  Final   Culture   Final    NO GROWTH Performed at [facility], [place] [address] [place], [place] [address]     Report Status 03/11/2018 FINAL  Final  Blood Culture (routine x 2)     Status: None   Collection Time: 03/09/18  8:18 PM  Result Value Ref Range Status   Specimen Description BLOOD RIGHT WRIST  Final   Special Requests IN PEDIATRIC BOTTLE Blood Culture adequate volume  Final   Culture   Final    NO GROWTH 5 DAYS Performed at [facility], [place] [address], [place], [place] [address]    Report Status 03/14/2018 FINAL  Final  MRSA PCR Screening     Status: Abnormal   Collection Time: 03/10/18  4:33 PM  Result Value Ref Range Status   MRSA by PCR POSITIVE (A) NEGATIVE Final    Comment:        The GeneXpert MRSA Assay (FDA approved for NASAL specimens only), is one component of a comprehensive MRSA colonization surveillance program. It is not intended to diagnose MRSA infection nor to guide or monitor treatment for MRSA infections. RESULT CALLED TO, READ BACK BY AND VERIFIED WITH: A.LOWE,RN AT 0014 BY L.PITT 03/11/18          Radiology Studies: No results found.    Scheduled Meds: . chlorhexidine gluconate (MEDLINE KIT)  15 mL Mouth Rinse BID  . Chlorhexidine Gluconate Cloth  6 each Topical Daily  . collagenase   Topical Daily  . enoxaparin (LOVENOX) injection  60 mg Subcutaneous Q12H  . folic acid  5 mg Oral Daily  . free water  200 mL Per Tube Q8H  . insulin aspart  0-9 Units Subcutaneous Q4H  . ipratropium-albuterol  3 mL Nebulization BID  . levETIRAcetam  1,500 mg Per Tube Q12H  .  levothyroxine  50 mcg Per Tube QAC breakfast  . mouth rinse  15 mL Mouth Rinse 10 times per day  . metoprolol tartrate  50 mg Per Tube BID  . nystatin  5 mL Oral QID  . pantoprazole sodium  40 mg Per Tube QHS  . phenytoin  100 mg Per Tube BID   And  . phenytoin  200 mg Per Tube Q1400  . QUEtiapine  25 mg Per Tube BID  . sodium chloride flush  10-40 mL Intracatheter Q12H   Continuous Infusions: . feeding supplement (PIVOT 1.5 CAL) 1,000 mL (03/19/18 0619)     LOS: 10 days     Time spent in minutes: 35    Debbe Odea, MD [facility] Pager: [email or web address] Password [email or web address] 03/19/2018, 3:03 PM

## 2018-03-19 NOTE — Progress Notes (Signed)
Patient placed back on full vent support by RN approximately around 13:00. Patient attempted to return to PS/CPAP with periods of Apnea causing low VE alarms. Will continue full vent support at this time.

## 2018-03-19 NOTE — Progress Notes (Signed)
Nutrition Follow-up  DOCUMENTATION CODES:   Not applicable  INTERVENTION:   Tube Feeding:  Continue Pivot 1.5 @ 50 ml/hr   NUTRITION DIAGNOSIS:   Inadequate oral intake related to inability to eat, lethargy/confusion as evidenced by NPO status.  Being addressed via TF  GOAL:   Patient will meet greater than or equal to 90% of their needs  Met  MONITOR:   Weight trends, TF tolerance, Skin, I & O's  REASON FOR ASSESSMENT:   Ventilator, Consult Enteral/tube feeding initiation and management  ASSESSMENT:   56 y. male with PMH of ventilator dependent respiratory failure s/p tracheostomy, G-tube dependent, right leg amputation, COPD, CVA with residual left hemiplegia, DM, MDRO including EBSL, MRSA, VRE, and seizures. Pt is a resident at [facility] [facility] sent for fever and respiratory distress.   Pt awaiting bed at [facility] Pt remains on vent support via trach with intermittent PS mode wean as tolerated Tolerating Pivot 1.5 @ 50 ml/hr, 200 mL free water every 8 hours via PEG tube  Labs: reviewed Meds: reviewed  Diet Order:  Diet - low sodium heart healthy  EDUCATION NEEDS:   Not appropriate for education at this time  Skin:  Skin Assessment: Skin Integrity Issues: Skin Integrity Issues:: Stage II, Stage III, Unstageable, Other (Comment) Stage II: R hand Stage III: ischial tuberosity Unstageable: Left hip, coccyx Other: healing/scabbing foot  Last BM:  4/4 colostomy  Height:   Ht Readings from Last 1 Encounters:  03/09/18 6' (1.829 m)    Weight:   Wt Readings from Last 1 Encounters:  03/19/18 128 lb 1.4 oz (58.1 kg)    Ideal Body Weight:  65.9 kg  BMI:  Body mass index is 17.37 kg/m.  Estimated Nutritional Needs:   Kcal:  1800-2000  Protein:  90-105g  Fluid:  >1.8L   Kerman Passey MS, RD, LDN, CNSC [phone] Pager  [phone] Weekend/On-Call Pager

## 2018-03-20 DIAGNOSIS — J9611 Chronic respiratory failure with hypoxia: Secondary | ICD-10-CM | POA: Diagnosis not present

## 2018-03-20 DIAGNOSIS — A419 Sepsis, unspecified organism: Secondary | ICD-10-CM | POA: Diagnosis not present

## 2018-03-20 DIAGNOSIS — J9601 Acute respiratory failure with hypoxia: Secondary | ICD-10-CM | POA: Diagnosis not present

## 2018-03-20 DIAGNOSIS — L89159 Pressure ulcer of sacral region, unspecified stage: Secondary | ICD-10-CM | POA: Diagnosis not present

## 2018-03-20 LAB — GLUCOSE, CAPILLARY
GLUCOSE-CAPILLARY: 104 mg/dL — AB (ref 65–99)
Glucose-Capillary: 137 mg/dL — ABNORMAL HIGH (ref 65–99)
Glucose-Capillary: 123 mg/dL — ABNORMAL HIGH (ref 65–99)
Glucose-Capillary: 110 mg/dL — ABNORMAL HIGH (ref 65–99)
Glucose-Capillary: 131 mg/dL — ABNORMAL HIGH (ref 65–99)

## 2018-03-20 LAB — CBC
HEMATOCRIT: 36.1 % — AB (ref 39.0–52.0)
Hemoglobin: 10.9 g/dL — ABNORMAL LOW (ref 13.0–17.0)
MCH: 27.9 pg (ref 26.0–34.0)
MCHC: 30.2 g/dL (ref 30.0–36.0)
MCV: 92.3 fL (ref 78.0–100.0)
Platelets: 315 10*3/uL (ref 150–400)
RBC: 3.91 MIL/uL — ABNORMAL LOW (ref 4.22–5.81)
RDW: 16.5 % — AB (ref 11.5–15.5)
WBC: 9.4 10*3/uL (ref 4.0–10.5)

## 2018-03-20 LAB — BASIC METABOLIC PANEL
Anion gap: 7 (ref 5–15)
BUN: 21 mg/dL — AB (ref 6–20)
CO2: 27 mmol/L (ref 22–32)
CREATININE: 0.36 mg/dL — AB (ref 0.61–1.24)
Calcium: 8.6 mg/dL — ABNORMAL LOW (ref 8.9–10.3)
Chloride: 101 mmol/L (ref 101–111)
GFR calc Af Amer: >60 mL/min (ref >60)
GLUCOSE: 129 mg/dL — AB (ref 65–99)
Potassium: 4.2 mmol/L (ref 3.5–5.1)
Sodium: 135 mmol/L (ref 135–145)

## 2018-03-20 NOTE — Progress Notes (Signed)
PROGRESS NOTE    Joe Sandoval   [id number]:[id number]  DOB: [birth year]  DOA: 03/09/2018 PCP: Townsend Roger, MD   Brief Narrative:  Joe Sandoval  is a complicated 72 y.o. male coming from [facility] with history of seizures, ventilator dependent respiratory failure, tracheostomy PEG tube placement colostomy, history of ESBL infection, COPD, CVA with right-sided weakness, right lower extremity amputation multiple sacral decubitus ulcers was brought to the ER after patient was found to be febrile with respiratory distress and hypoxia.  Had finished a recent course of antibiotics vancomycin and cefepime 5 days previously [2/16 had BC + for S hemolyticus and sputum cult=Pseudomonas, and proteus mirabilis]   Subjective:  non- communicative      Assessment & Plan:   Principal Problem:   Sepsis ([place])   Acute respiratory failure with hypoxia ([place])   HCAP (healthcare-associated pneumonia)   Ventilator dependent ([place]) - completed a 7 day course of Zovox and Meropenem on 3/31 - PCCM following    Seizure on 4/1 H/o CVA with residual left hemiplegia and 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 -episodic seizures noted 4/1 ~15:00-  -no further apparent Sz -continue Keppra 1.5 every 12 per home dose -Increased Dilantin from 100 Q8 to  to 200 every 8, corrected Dilantin level 03/17/18 was 5.7-  pharmacy dosing now- appreciate it  PEG tube, chronic foley, ileostomy - cont tube feeds and free water- following I and O - keep even    Decubitus ulcers - multiple on sacrum/ buttocks - cont wound care  DM2 - Q 4 SSI  HTN  - Lopressor- BP stable       DVT prophylaxis: Lovenox Code Status: partial Family Communication: apparenlty muliple called were made but no on reached- poor prognosis Disposition Plan: [facility] when bed available Consultants:   PCCM Procedures:     Antimicrobials:  Anti-infectives (From admission, onward)   Start     Dose/Rate Route Frequency Ordered Stop   03/12/18 1000   fluconazole (DIFLUCAN) IVPB 100 mg  Status:  Discontinued     100 mg 50 mL/hr over 60 Minutes Intravenous Every 24 hours 03/11/18 0912 03/12/18 1412   03/11/18 1000  fluconazole (DIFLUCAN) IVPB 200 mg     200 mg 100 mL/hr over 60 Minutes Intravenous  Once 03/11/18 0912 03/11/18 1121   03/10/18 0400  meropenem (MERREM) 1 g in sodium chloride 0.9 % 100 mL IVPB     1 g 200 mL/hr over 30 Minutes Intravenous Every 8 hours 03/09/18 1937 03/15/18 2021   03/09/18 2000  linezolid (ZYVOX) IVPB 600 mg     600 mg 300 mL/hr over 60 Minutes Intravenous Every 12 hours 03/09/18 1937 03/15/18 2317   03/09/18 1945  meropenem (MERREM) 1 g in sodium chloride 0.9 % 100 mL IVPB     1 g 200 mL/hr over 30 Minutes Intravenous  Once 03/09/18 1941 03/09/18 2053   03/09/18 1900  vancomycin (VANCOCIN) IVPB 750 mg/150 ml premix  Status:  Discontinued     750 mg 150 mL/hr over 60 Minutes Intravenous  Once 03/09/18 1859 03/09/18 1907   03/09/18 1815  piperacillin-tazobactam (ZOSYN) IVPB 3.375 g     3.375 g 100 mL/hr over 30 Minutes Intravenous  Once 03/09/18 1811 03/09/18 1908       Objective: Vitals:   03/20/18 1000 03/20/18 1100 03/20/18 1219 03/20/18 1232  BP: (!) 122/51 (!) 92/54 (!) 100/51   Pulse: (!) 119 92 95   Resp: (!) 24 (!) 23 (!) 23  Temp:    99.4 F (37.4 C)  TempSrc:    Oral  SpO2: 99% 97% 99%   Weight:      Height:        Intake/Output Summary (Last 24 hours) at 03/20/2018 1348 Last data filed at 03/20/2018 1200 Gross per 24 hour  Intake 1140 ml  Output 1620 ml  Net -480 ml   Filed Weights   03/18/18 0406 03/19/18 0450 03/20/18 0500  Weight: 58.2 kg (128 lb 4.9 oz) 58.1 kg (128 lb 1.4 oz) 57.7 kg (127 lb 3.3 oz)    Examination: General exam: Appears comfortable on vent HEENT: trach in place Respiratory system: Clear to auscultation.   Cardiovascular system: S1 & S2 heard, RRR.   Gastrointestinal system: Abdomen soft, PEG intact,   Ileostomy noted  Central nervous system: Alert     Extremities: No cyanosis, clubbing or edema Skin:  Multiple sacral decubitus ulcers with clean bases    Data Reviewed: I have personally reviewed following labs and imaging studies  CBC: Recent Labs  Lab 03/15/18 0454 03/15/18 1700 03/16/18 0541 03/18/18 0420 03/20/18 0722  WBC 7.5  --  9.3 8.6 9.4  NEUTROABS 4.5  --  5.9 5.2  --   HGB 7.7* 10.6* 10.9* 10.5* 10.9*  HCT 26.8* 34.5* 35.9* 34.6* 36.1*  MCV 93.1  --  91.3 91.1 92.3  PLT 301  --  362 296 944   Basic Metabolic Panel: Recent Labs  Lab 03/15/18 0454 03/16/18 0541 03/18/18 0419 03/18/18 0420 03/20/18 0723  NA 139 137 137  --  135  K 4.0 3.8 4.2  --  4.2  CL 106 103 100*  --  101  CO2 28 27 27   --  27  GLUCOSE 107* 106* 118*  --  129*  BUN 9 10 14   --  21*  CREATININE 0.39* 0.35* 0.34*  --  0.36*  CALCIUM 8.1* 8.3* 8.3*  --  8.6*  MG  --   --   --  1.8  --   PHOS 2.5 2.1* 3.6  --   --    GFR: Estimated Creatinine Clearance: 69.1 mL/min (A) (by C-G formula based on SCr of 0.36 mg/dL (L)). Liver Function Tests: Recent Labs  Lab 03/15/18 0454 03/16/18 0541 03/18/18 0419 03/19/18 0537  ALBUMIN 1.3* 1.5* 1.5* 1.6*   No results for input(s): LIPASE, AMYLASE in the last 168 hours. No results for input(s): AMMONIA in the last 168 hours. Coagulation Profile: No results for input(s): INR, PROTIME in the last 168 hours. Cardiac Enzymes: No results for input(s): CKTOTAL, CKMB, CKMBINDEX, TROPONINI in the last 168 hours. BNP (last 3 results) No results for input(s): PROBNP in the last 8760 hours. HbA1C: No results for input(s): HGBA1C in the last 72 hours. CBG: Recent Labs  Lab 03/19/18 1933 03/19/18 2322 03/20/18 0330 03/20/18 0739 03/20/18 1231  GLUCAP 123* 125* 104* 137* 123*   Lipid Profile: No results for input(s): CHOL, HDL, LDLCALC, TRIG, CHOLHDL, LDLDIRECT in the last 72 hours. Thyroid Function Tests: No results for input(s): TSH, T4TOTAL, FREET4, T3FREE, THYROIDAB in the last 72  hours. Anemia Panel: No results for input(s): VITAMINB12, FOLATE, FERRITIN, TIBC, IRON, RETICCTPCT in the last 72 hours. Urine analysis:    Component Value Date/Time   COLORURINE AMBER (A) 03/09/2018 1831   APPEARANCEUR TURBID (A) 03/09/2018 1831   LABSPEC 1.021 03/09/2018 1831   PHURINE 5.0 03/09/2018 1831   GLUCOSEU NEGATIVE 03/09/2018 1831   HGBUR MODERATE (A) 03/09/2018 1831  [place] NEGATIVE 03/09/2018 [place] 03/09/2018 1831   PROTEINUR 100 (A) 03/09/2018 1831   NITRITE NEGATIVE 03/09/2018 1831   LEUKOCYTESUR LARGE (A) 03/09/2018 1831   Sepsis Labs: @LABRCNTIP (procalcitonin:4,lacticidven:4) ) Recent Results (from the past 240 hour(s))  MRSA PCR Screening     Status: Abnormal   Collection Time: 03/10/18  4:33 PM  Result Value Ref Range Status   MRSA by PCR POSITIVE (A) NEGATIVE Final    Comment:        The GeneXpert MRSA Assay (FDA approved for NASAL specimens only), is one component of a comprehensive MRSA colonization surveillance program. It is not intended to diagnose MRSA infection nor to guide or monitor treatment for MRSA infections. RESULT CALLED TO, READ BACK BY AND VERIFIED WITH: A.LOWE,RN AT 0014 BY L.PITT 03/11/18          Radiology Studies: No results found.    Scheduled Meds: . chlorhexidine gluconate (MEDLINE KIT)  15 mL Mouth Rinse BID  . Chlorhexidine Gluconate Cloth  6 each Topical Daily  . collagenase   Topical Daily  . enoxaparin (LOVENOX) injection  60 mg Subcutaneous Q12H  . folic acid  5 mg Oral Daily  . free water  200 mL Per Tube Q8H  . insulin aspart  0-9 Units Subcutaneous Q4H  . ipratropium-albuterol  3 mL Nebulization BID  . levETIRAcetam  1,500 mg Per Tube Q12H  . levothyroxine  50 mcg Per Tube QAC breakfast  . mouth rinse  15 mL Mouth Rinse 10 times per day  . metoprolol tartrate  50 mg Per Tube BID  . pantoprazole sodium  40 mg Per Tube QHS  . phenytoin  100 mg Per Tube BID   And  . phenytoin   200 mg Per Tube Q1400  . QUEtiapine  25 mg Per Tube BID  . sodium chloride flush  10-40 mL Intracatheter Q12H   Continuous Infusions: . feeding supplement (PIVOT 1.5 CAL) 50 mL/hr at 03/20/18 0700     LOS: 11 days    Time spent in minutes: 35    Debbe Odea, MD [facility] Pager: [email or web address] Password [email or web address] 03/20/2018, 1:48 PM

## 2018-03-20 NOTE — Progress Notes (Signed)
Pt weaned for an hour and 20 minutes on PS 10/5. RT placed pt back on previous PRVC settings d/t sustained RR in the high 30s. RN aware and Pt's VS stable. RT will continue to monitor.

## 2018-03-20 NOTE — Clinical Social Work Note (Signed)
Per [facility], pt will have a bed Monday.   Malverne Park OaksBridget Takayla Baillie, [place] [phone]

## 2018-03-20 NOTE — Progress Notes (Signed)
MEDICATION RELATED CONSULT NOTE - Follow-Up  Pharmacy Consult for Phenytoin Indication: Seizures  Allergies  Allergen Reactions  . Iodine Other (See Comments)    Unknown reaction (listed on [facility] paperwork)  . Shellfish Allergy Other (See Comments)    Unknown reaction (listed on [facility] Paperwork    Patient Measurements: Height: 6' (182.9 cm) Weight: 127 lb 3.3 oz (57.7 kg) IBW/kg (Calculated) : 77.6 Vital Signs: Temp: 99.3 F (37.4 C) (04/05 0742) Temp Source: Oral (04/05 0742) BP: 124/53 (04/05 0900) Pulse Rate: 114 (04/05 0900) Intake/Output from previous day: 04/04 0701 - 04/05 0700 In: 1220 [I.V.:20; NG/GT:1200] Out: 1635 [Urine:1485; Stool:150] Intake/Output from this shift: Total I/O In: -  Out: 100 [Urine:100]  Labs: Recent Labs    03/18/18 0419 03/18/18 0420 03/19/18 0537 03/20/18 0722 03/20/18 0723  WBC  --  8.6  --  9.4  --   HGB  --  10.5*  --  10.9*  --   HCT  --  34.6*  --  36.1*  --   PLT  --  296  --  315  --   CREATININE 0.34*  --   --   --  0.36*  MG  --  1.8  --   --   --   PHOS 3.6  --   --   --   --   ALBUMIN 1.5*  --  1.6*  --   --    Estimated Creatinine Clearance: 69.1 mL/min (A) (by C-G formula based on SCr of 0.36 mg/dL (L)).  Assessment: 72 year old male with PEG and history of seizures on Keppra and Phenytoin per tube. Pharmacy consulted to help with Phenytoin dosing.   The patient remains stable on the current dosing of phenytoin without seizures activity noted. The phenytoin dose was adjusted on 4/2 and is yet at steady state however a level was checked on 4/4 which corrected to the therapeutic range (measured level 4.1, alb 1.5, corrects to ~10.2). Will plan to recheck another level at steady state - likely on Mon, 4/8.   Goal of Therapy:  Corrected Phenytoin level 10 to 20 mg/L  Plan:  1. Continue Phenytoin 100 mg qam, 200 mg at 1400, 100 mg hs 2. Will plan to recheck a level at steady state likely on Mon, 4/8 AM.  3.  Will continue to monitor for seizure activity  Thank you for allowing pharmacy to be a part of this patient's care.  Georgina PillionElizabeth Casin Federici, PharmD, BCPS Clinical Pharmacist Pager: [phone] 03/20/2018 10:09 AM

## 2018-03-21 DIAGNOSIS — A419 Sepsis, unspecified organism: Secondary | ICD-10-CM | POA: Diagnosis not present

## 2018-03-21 DIAGNOSIS — L89159 Pressure ulcer of sacral region, unspecified stage: Secondary | ICD-10-CM | POA: Diagnosis not present

## 2018-03-21 DIAGNOSIS — J9601 Acute respiratory failure with hypoxia: Secondary | ICD-10-CM | POA: Diagnosis not present

## 2018-03-21 DIAGNOSIS — J9611 Chronic respiratory failure with hypoxia: Secondary | ICD-10-CM | POA: Diagnosis not present

## 2018-03-21 LAB — GLUCOSE, CAPILLARY
GLUCOSE-CAPILLARY: 140 mg/dL — AB (ref 65–99)
GLUCOSE-CAPILLARY: 129 mg/dL — AB (ref 65–99)
GLUCOSE-CAPILLARY: 124 mg/dL — AB (ref 65–99)
GLUCOSE-CAPILLARY: 124 mg/dL — AB (ref 65–99)
Glucose-Capillary: 142 mg/dL — ABNORMAL HIGH (ref 65–99)

## 2018-03-21 NOTE — Progress Notes (Signed)
PROGRESS NOTE    Joe Sandoval   [id number]  DOB: Feb 06, 1946  DOA: 03/09/2018 PCP: Townsend Roger, MD   Brief Narrative:  Joe Sandoval  is a complicated 72 y.o. male coming from [facility] with history of seizures, ventilator dependent respiratory failure, tracheostomy PEG tube placement colostomy, history of ESBL infection, COPD, CVA with right-sided weakness, right lower extremity amputation multiple sacral decubitus ulcers was brought to the ER after patient was found to be febrile with respiratory distress and hypoxia.  Had finished a recent course of antibiotics vancomycin and cefepime 5 days previously [2/16 had BC + for S hemolyticus and sputum cult=Pseudomonas, and proteus mirabilis]   Subjective:  non- communicative    Assessment & Plan:   Principal Problem:   Sepsis ([place])   Acute respiratory failure with hypoxia ([place])   HCAP (healthcare-associated pneumonia)   Ventilator dependent ([place]) - completed a 7 day course of Zovox and Meropenem on 3/31 - PCCM following    Seizure on 4/1 H/o CVA with residual left hemiplegia and 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 -episodic seizures noted 4/1 ~15:00-  -no further apparent Sz -continue Keppra 1.5 every 12 per home dose -Increased Dilantin from 100 Q8 to  to 200 every 8, corrected Dilantin level 03/17/18 was 5.7-  pharmacy dosing now- appreciate it  PEG tube, chronic foley, ileostomy - cont tube feeds and free water- following I and O - keep even    Decubitus ulcers - multiple on sacrum/ buttocks - cont wound care  DM2 - Q 4 SSI  HTN  - Lopressor- BP stable       DVT prophylaxis: Lovenox Code Status: partial Family Communication: apparenlty muliple called were made but no on reached- poor prognosis Disposition Plan: [facility] when bed available Consultants:   PCCM Procedures:     Antimicrobials:  Anti-infectives (From admission, onward)   Start     Dose/Rate Route Frequency Ordered Stop   03/12/18 1000   fluconazole (DIFLUCAN) IVPB 100 mg  Status:  Discontinued     100 mg 50 mL/hr over 60 Minutes Intravenous Every 24 hours 03/11/18 0912 03/12/18 1412   03/11/18 1000  fluconazole (DIFLUCAN) IVPB 200 mg     200 mg 100 mL/hr over 60 Minutes Intravenous  Once 03/11/18 0912 03/11/18 1121   03/10/18 0400  meropenem (MERREM) 1 g in sodium chloride 0.9 % 100 mL IVPB     1 g 200 mL/hr over 30 Minutes Intravenous Every 8 hours 03/09/18 1937 03/15/18 2021   03/09/18 2000  linezolid (ZYVOX) IVPB 600 mg     600 mg 300 mL/hr over 60 Minutes Intravenous Every 12 hours 03/09/18 1937 03/15/18 2317   03/09/18 1945  meropenem (MERREM) 1 g in sodium chloride 0.9 % 100 mL IVPB     1 g 200 mL/hr over 30 Minutes Intravenous  Once 03/09/18 1941 03/09/18 2053   03/09/18 1900  vancomycin (VANCOCIN) IVPB 750 mg/150 ml premix  Status:  Discontinued     750 mg 150 mL/hr over 60 Minutes Intravenous  Once 03/09/18 1859 03/09/18 1907   03/09/18 1815  piperacillin-tazobactam (ZOSYN) IVPB 3.375 g     3.375 g 100 mL/hr over 30 Minutes Intravenous  Once 03/09/18 1811 03/09/18 1908       Objective: Vitals:   03/21/18 0924 03/21/18 1136 03/21/18 1145 03/21/18 1524  BP: (!) 111/54 (!) 104/58    Pulse: (!) 113 95    Resp:  (!) 23    Temp:   98.4 F (  36.9 C)   TempSrc:   Oral   SpO2:  96%  99%  Weight:      Height:        Intake/Output Summary (Last 24 hours) at 03/21/2018 1550 Last data filed at 03/21/2018 0926 Gross per 24 hour  Intake 1100 ml  Output 930 ml  Net 170 ml   Filed Weights   03/19/18 0450 03/20/18 0500 03/21/18 0403  Weight: 58.1 kg (128 lb 1.4 oz) 57.7 kg (127 lb 3.3 oz) 56.7 kg (125 lb)    Examination: General exam: Appears comfortable on vent HEENT: trach in place Respiratory system: Clear to auscultation.   Cardiovascular system: S1 & S2 heard, RRR.   Gastrointestinal system: Abdomen soft, PEG intact,   Ileostomy noted  Central nervous system: Alert   Extremities: No cyanosis,  clubbing or edema Skin:  Multiple sacral decubitus ulcers with clean bases    Data Reviewed: I have personally reviewed following labs and imaging studies  CBC: Recent Labs  Lab 03/15/18 0454 03/15/18 1700 03/16/18 0541 03/18/18 0420 03/20/18 0722  WBC 7.5  --  9.3 8.6 9.4  NEUTROABS 4.5  --  5.9 5.2  --   HGB 7.7* 10.6* 10.9* 10.5* 10.9*  HCT 26.8* 34.5* 35.9* 34.6* 36.1*  MCV 93.1  --  91.3 91.1 92.3  PLT 301  --  362 296 711   Basic Metabolic Panel: Recent Labs  Lab 03/15/18 0454 03/16/18 0541 03/18/18 0419 03/18/18 0420 03/20/18 0723  NA 139 137 137  --  135  K 4.0 3.8 4.2  --  4.2  CL 106 103 100*  --  101  CO2 _0 --  27  GLUCOSE 107* 106* 118*  --  129*  BUN _1 --  21*  CREATININE 0.39* 0.35* 0.34*  --  0.36*  CALCIUM 8.1* 8.3* 8.3*  --  8.6*  MG  --   --   --  1.8  --   PHOS 2.5 2.1* 3.6  --   --    GFR: Estimated Creatinine Clearance: 67.9 mL/min (A) (by C-G formula based on SCr of 0.36 mg/dL (L)). Liver Function Tests: Recent Labs  Lab 03/15/18 0454 03/16/18 0541 03/18/18 0419 03/19/18 0537  ALBUMIN 1.3* 1.5* 1.5* 1.6*   No results for input(s): LIPASE, AMYLASE in the last 168 hours. No results for input(s): AMMONIA in the last 168 hours. Coagulation Profile: No results for input(s): INR, PROTIME in the last 168 hours. Cardiac Enzymes: No results for input(s): CKTOTAL, CKMB, CKMBINDEX, TROPONINI in the last 168 hours. BNP (last 3 results) No results for input(s): PROBNP in the last 8760 hours. HbA1C: No results for input(s): HGBA1C in the last 72 hours. CBG: Recent Labs  Lab 03/20/18 2034 03/21/18 0015 03/21/18 0412 03/21/18 0804 03/21/18 1148  GLUCAP 131* 142* 140* 129* 124*   Lipid Profile: No results for input(s): CHOL, HDL, LDLCALC, TRIG, CHOLHDL, LDLDIRECT in the last 72 hours. Thyroid Function Tests: No results for input(s): TSH, T4TOTAL, FREET4, T3FREE, THYROIDAB in the last 72 hours. Anemia Panel: No results  for input(s): VITAMINB12, FOLATE, FERRITIN, TIBC, IRON, RETICCTPCT in the last 72 hours. Urine analysis:    Component Value Date/Time   COLORURINE AMBER (A) 03/09/2018 1831   APPEARANCEUR TURBID (A) 03/09/2018 1831   LABSPEC 1.021 03/09/2018 1831   PHURINE 5.0 03/09/2018 1831   GLUCOSEU NEGATIVE 03/09/2018 1831   HGBUR MODERATE (A) 03/09/2018 1831   BILIRUBINUR NEGATIVE 03/09/2018 1831   KETONESUR  NEGATIVE 03/09/2018 1831   PROTEINUR 100 (A) 03/09/2018 1831   NITRITE NEGATIVE 03/09/2018 1831   LEUKOCYTESUR LARGE (A) 03/09/2018 1831   Sepsis Labs: _0 (procalcitonin:4,lacticidven:4) ) No results found for this or any previous visit (from the past 240 hour(s)).       Radiology Studies: No results found.    Scheduled Meds: . chlorhexidine gluconate (MEDLINE KIT)  15 mL Mouth Rinse BID  . Chlorhexidine Gluconate Cloth  6 each Topical Daily  . collagenase   Topical Daily  . enoxaparin (LOVENOX) injection  60 mg Subcutaneous Q12H  . folic acid  5 mg Oral Daily  . free water  200 mL Per Tube Q8H  . insulin aspart  0-9 Units Subcutaneous Q4H  . ipratropium-albuterol  3 mL Nebulization BID  . levETIRAcetam  1,500 mg Per Tube Q12H  . levothyroxine  50 mcg Per Tube QAC breakfast  . mouth rinse  15 mL Mouth Rinse 10 times per day  . metoprolol tartrate  50 mg Per Tube BID  . pantoprazole sodium  40 mg Per Tube QHS  . phenytoin  100 mg Per Tube BID   And  . phenytoin  200 mg Per Tube Q1400  . QUEtiapine  25 mg Per Tube BID  . sodium chloride flush  10-40 mL Intracatheter Q12H   Continuous Infusions: . feeding supplement (PIVOT 1.5 CAL) 1,000 mL (03/21/18 0350)     LOS: 12 days    Time spent in minutes: 35    Debbe Odea, MD [facility] Pager: [email or web address] Password [email or web address] 03/21/2018, 3:50 PM

## 2018-03-22 DIAGNOSIS — J9611 Chronic respiratory failure with hypoxia: Secondary | ICD-10-CM | POA: Diagnosis not present

## 2018-03-22 DIAGNOSIS — A419 Sepsis, unspecified organism: Secondary | ICD-10-CM | POA: Diagnosis not present

## 2018-03-22 DIAGNOSIS — J9601 Acute respiratory failure with hypoxia: Secondary | ICD-10-CM | POA: Diagnosis not present

## 2018-03-22 DIAGNOSIS — L89159 Pressure ulcer of sacral region, unspecified stage: Secondary | ICD-10-CM | POA: Diagnosis not present

## 2018-03-22 LAB — GLUCOSE, CAPILLARY
GLUCOSE-CAPILLARY: 105 mg/dL — AB (ref 65–99)
GLUCOSE-CAPILLARY: 109 mg/dL — AB (ref 65–99)
GLUCOSE-CAPILLARY: 120 mg/dL — AB (ref 65–99)
Glucose-Capillary: 115 mg/dL — ABNORMAL HIGH (ref 65–99)
Glucose-Capillary: 113 mg/dL — ABNORMAL HIGH (ref 65–99)
Glucose-Capillary: 101 mg/dL — ABNORMAL HIGH (ref 65–99)
Glucose-Capillary: 95 mg/dL (ref 65–99)

## 2018-03-22 NOTE — Progress Notes (Signed)
While in unit, noted pt vent alarming on 2 occasions.  Came to room and found pt alarming apnea w/ apnea back up mode on.  Pt changed back to full vent support.  Unit RT aware.

## 2018-03-22 NOTE — Progress Notes (Signed)
Pt weaned on PS 10/5 for 4.5 hours. Pt placed back on full support after sustained episodes of apnea. VS stable and RT will continue to monitor.

## 2018-03-22 NOTE — Plan of Care (Signed)
  Problem: Clinical Measurements: Goal: Will remain free from infection Outcome: Progressing   Problem: Elimination: Goal: Will not experience complications related to urinary retention Outcome: Progressing   Problem: Pain Managment: Goal: General experience of comfort will improve Outcome: Progressing   Problem: Health Behavior/Discharge Planning: Goal: Ability to manage health-related needs will improve Outcome: Not Progressing

## 2018-03-22 NOTE — Plan of Care (Signed)
  Problem: Health Behavior/Discharge Planning: Goal: Ability to manage health-related needs will improve Outcome: Not Progressing   Problem: Clinical Measurements: Goal: Will remain free from infection Outcome: Adequate for Discharge Goal: Diagnostic test results will improve Outcome: Adequate for Discharge Goal: Respiratory complications will improve Outcome: Adequate for Discharge   Problem: Clinical Measurements: Goal: Cardiovascular complication will be avoided Outcome: Completed/Met

## 2018-03-22 NOTE — Progress Notes (Signed)
PROGRESS NOTE    Joe Sandoval   ION:[id number]  DOB: 1945-12-26  DOA: 03/09/2018 PCP: Townsend Roger, MD   Brief Narrative:  Joe Sandoval  is a complicated 72 y.o. male coming from [facility] with history of seizures, ventilator dependent respiratory failure, tracheostomy PEG tube placement colostomy, history of ESBL infection, COPD, CVA with right-sided weakness, right lower extremity amputation multiple sacral decubitus ulcers was brought to the ER after patient was found to be febrile with respiratory distress and hypoxia.  Had finished a recent course of antibiotics vancomycin and cefepime 5 days previously [2/16 had BC + for S hemolyticus and sputum cult=Pseudomonas, and proteus mirabilis]   Subjective: Evaluated this AM. No change from yesterday.    Assessment & Plan:   Principal Problem:   Sepsis     Acute on chronic respiratory failure with hypoxia  -   Ventilator dependent     HCAP (healthcare-associated pneumonia) - completed a 7 day course of Zovox and Meropenem on 3/31 - continues to be afebrile - PCCM following intermittently for vent management/ trach    Seizure on 4/1 H/o CVA with residual left hemiplegia and 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 -episodic seizures noted 4/1 ~15:00-  -no further apparent Sz -continue Keppra 1.5 every 12 per home dose -Increased Dilantin from 100 Q8 to  to 200 every 8, corrected Dilantin level 03/17/18 was 5.7-  pharmacy dosing now- appreciate it  PEG tube, chronic foley, ileostomy - cont tube feeds and free water- following I and O - keep even    Decubitus ulcers - multiple on sacrum/ buttocks - cont wound care  DM2 - Q 4 SSI  HTN  - Lopressor- BP stable       DVT prophylaxis: Lovenox Code Status: partial Family Communication: apparenlty muliple called were made but no on reached- poor prognosis Disposition Plan: [facility] when bed available- likely tomorrow Consultants:   PCCM Procedures:     Antimicrobials:    Anti-infectives (From admission, onward)   Start     Dose/Rate Route Frequency Ordered Stop   03/12/18 1000  fluconazole (DIFLUCAN) IVPB 100 mg  Status:  Discontinued     100 mg 50 mL/hr over 60 Minutes Intravenous Every 24 hours 03/11/18 0912 03/12/18 1412   03/11/18 1000  fluconazole (DIFLUCAN) IVPB 200 mg     200 mg 100 mL/hr over 60 Minutes Intravenous  Once 03/11/18 0912 03/11/18 1121   03/10/18 0400  meropenem (MERREM) 1 g in sodium chloride 0.9 % 100 mL IVPB     1 g 200 mL/hr over 30 Minutes Intravenous Every 8 hours 03/09/18 1937 03/15/18 2021   03/09/18 2000  linezolid (ZYVOX) IVPB 600 mg     600 mg 300 mL/hr over 60 Minutes Intravenous Every 12 hours 03/09/18 1937 03/15/18 2317   03/09/18 1945  meropenem (MERREM) 1 g in sodium chloride 0.9 % 100 mL IVPB     1 g 200 mL/hr over 30 Minutes Intravenous  Once 03/09/18 1941 03/09/18 2053   03/09/18 1900  vancomycin (VANCOCIN) IVPB 750 mg/150 ml premix  Status:  Discontinued     750 mg 150 mL/hr over 60 Minutes Intravenous  Once 03/09/18 1859 03/09/18 1907   03/09/18 1815  piperacillin-tazobactam (ZOSYN) IVPB 3.375 g     3.375 g 100 mL/hr over 30 Minutes Intravenous  Once 03/09/18 1811 03/09/18 1908       Objective: Vitals:   03/22/18 0900 03/22/18 1000 03/22/18 1123 03/22/18 1145  BP: 116/62 (!) 106/57 Marland Kitchen)  107/57   Pulse: (!) 105 92 86   Resp: _0 Temp:    98.1 F (36.7 C)  TempSrc:    Oral  SpO2: 98% 100% 98%   Weight:      Height:        Intake/Output Summary (Last 24 hours) at 03/22/2018 1347 Last data filed at 03/22/2018 1335 Gross per 24 hour  Intake 1255 ml  Output 1570 ml  Net -315 ml   Filed Weights   03/20/18 0500 03/21/18 0403 03/22/18 0400  Weight: 57.7 kg (127 lb 3.3 oz) 56.7 kg (125 lb) 55.8 kg (123 lb 0.3 oz)    Examination: General exam: Appears comfortable on vent HEENT: trach in place Respiratory system: Clear to auscultation.   Cardiovascular system: S1 & S2 heard, RRR.    Gastrointestinal system: Abdomen soft, PEG intact,   Ileostomy noted  Central nervous system: Alert   Extremities: No cyanosis, clubbing or edema Skin:  Multiple sacral decubitus ulcers with clean bases    Data Reviewed: I have personally reviewed following labs and imaging studies  CBC: Recent Labs  Lab 03/15/18 1700 03/16/18 0541 03/18/18 0420 03/20/18 0722  WBC  --  9.3 8.6 9.4  NEUTROABS  --  5.9 5.2  --   HGB 10.6* 10.9* 10.5* 10.9*  HCT 34.5* 35.9* 34.6* 36.1*  MCV  --  91.3 91.1 92.3  PLT  --  362 296 595   Basic Metabolic Panel: Recent Labs  Lab 03/16/18 0541 03/18/18 0419 03/18/18 0420 03/20/18 0723  NA 137 137  --  135  K 3.8 4.2  --  4.2  CL 103 100*  --  101  CO2 27 27  --  27  GLUCOSE 106* 118*  --  129*  BUN 10 14  --  21*  CREATININE 0.35* 0.34*  --  0.36*  CALCIUM 8.3* 8.3*  --  8.6*  MG  --   --  1.8  --   PHOS 2.1* 3.6  --   --    GFR: Estimated Creatinine Clearance: 66.8 mL/min (A) (by C-G formula based on SCr of 0.36 mg/dL (L)). Liver Function Tests: Recent Labs  Lab 03/16/18 0541 03/18/18 0419 03/19/18 0537  ALBUMIN 1.5* 1.5* 1.6*   No results for input(s): LIPASE, AMYLASE in the last 168 hours. No results for input(s): AMMONIA in the last 168 hours. Coagulation Profile: No results for input(s): INR, PROTIME in the last 168 hours. Cardiac Enzymes: No results for input(s): CKTOTAL, CKMB, CKMBINDEX, TROPONINI in the last 168 hours. BNP (last 3 results) No results for input(s): PROBNP in the last 8760 hours. HbA1C: No results for input(s): HGBA1C in the last 72 hours. CBG: Recent Labs  Lab 03/21/18 2048 03/22/18 0020 03/22/18 0426 03/22/18 0757 03/22/18 1142  GLUCAP 115* 105* 120* 109* 113*   Lipid Profile: No results for input(s): CHOL, HDL, LDLCALC, TRIG, CHOLHDL, LDLDIRECT in the last 72 hours. Thyroid Function Tests: No results for input(s): TSH, T4TOTAL, FREET4, T3FREE, THYROIDAB in the last 72 hours. Anemia  Panel: No results for input(s): VITAMINB12, FOLATE, FERRITIN, TIBC, IRON, RETICCTPCT in the last 72 hours. Urine analysis:    Component Value Date/Time   COLORURINE AMBER (A) 03/09/2018 1831   APPEARANCEUR TURBID (A) 03/09/2018 1831   LABSPEC 1.021 03/09/2018 1831   PHURINE 5.0 03/09/2018 1831   GLUCOSEU NEGATIVE 03/09/2018 1831   HGBUR MODERATE (A) 03/09/2018 1831   BILIRUBINUR NEGATIVE 03/09/2018 1831   KETONESUR NEGATIVE 03/09/2018 1831  PROTEINUR 100 (A) 03/09/2018 1831   NITRITE NEGATIVE 03/09/2018 1831   LEUKOCYTESUR LARGE (A) 03/09/2018 1831   Sepsis Labs: _0 (procalcitonin:4,lacticidven:4) ) No results found for this or any previous visit (from the past 240 hour(s)).       Radiology Studies: No results found.    Scheduled Meds: . chlorhexidine gluconate (MEDLINE KIT)  15 mL Mouth Rinse BID  . Chlorhexidine Gluconate Cloth  6 each Topical Daily  . collagenase   Topical Daily  . enoxaparin (LOVENOX) injection  60 mg Subcutaneous Q12H  . folic acid  5 mg Oral Daily  . free water  200 mL Per Tube Q8H  . insulin aspart  0-9 Units Subcutaneous Q4H  . ipratropium-albuterol  3 mL Nebulization BID  . levETIRAcetam  1,500 mg Per Tube Q12H  . levothyroxine  50 mcg Per Tube QAC breakfast  . mouth rinse  15 mL Mouth Rinse 10 times per day  . metoprolol tartrate  50 mg Per Tube BID  . pantoprazole sodium  40 mg Per Tube QHS  . phenytoin  100 mg Per Tube BID   And  . phenytoin  200 mg Per Tube Q1400  . QUEtiapine  25 mg Per Tube BID  . sodium chloride flush  10-40 mL Intracatheter Q12H   Continuous Infusions: . feeding supplement (PIVOT 1.5 CAL) 50 mL/hr at 03/22/18 0600     LOS: 13 days    Time spent in minutes: 35    Debbe Odea, MD [facility] Pager: [email or web address] Password [email or web address] 03/22/2018, 1:47 PM

## 2018-03-23 DIAGNOSIS — J9601 Acute respiratory failure with hypoxia: Secondary | ICD-10-CM | POA: Diagnosis not present

## 2018-03-23 DIAGNOSIS — A419 Sepsis, unspecified organism: Secondary | ICD-10-CM | POA: Diagnosis not present

## 2018-03-23 DIAGNOSIS — J9611 Chronic respiratory failure with hypoxia: Secondary | ICD-10-CM | POA: Diagnosis not present

## 2018-03-23 DIAGNOSIS — L89159 Pressure ulcer of sacral region, unspecified stage: Secondary | ICD-10-CM | POA: Diagnosis not present

## 2018-03-23 DIAGNOSIS — Z9911 Dependence on respirator [ventilator] status: Secondary | ICD-10-CM | POA: Diagnosis not present

## 2018-03-23 DIAGNOSIS — G40909 Epilepsy, unspecified, not intractable, without status epilepticus: Secondary | ICD-10-CM

## 2018-03-23 DIAGNOSIS — J189 Pneumonia, unspecified organism: Secondary | ICD-10-CM | POA: Diagnosis not present

## 2018-03-23 LAB — BASIC METABOLIC PANEL
Anion gap: 9 (ref 5–15)
BUN: 23 mg/dL — ABNORMAL HIGH (ref 6–20)
CHLORIDE: 99 mmol/L — AB (ref 101–111)
CO2: 27 mmol/L (ref 22–32)
Calcium: 8.6 mg/dL — ABNORMAL LOW (ref 8.9–10.3)
Creatinine, Ser: 0.42 mg/dL — ABNORMAL LOW (ref 0.61–1.24)
Glucose, Bld: 114 mg/dL — ABNORMAL HIGH (ref 65–99)
POTASSIUM: 3.9 mmol/L (ref 3.5–5.1)
SODIUM: 135 mmol/L (ref 135–145)

## 2018-03-23 LAB — CBC
HCT: 32.6 % — ABNORMAL LOW (ref 39.0–52.0)
HEMOGLOBIN: 9.8 g/dL — AB (ref 13.0–17.0)
MCH: 27.6 pg (ref 26.0–34.0)
MCHC: 30.1 g/dL (ref 30.0–36.0)
MCV: 91.8 fL (ref 78.0–100.0)
PLATELETS: 403 10*3/uL — AB (ref 150–400)
RBC: 3.55 MIL/uL — AB (ref 4.22–5.81)
RDW: 16 % — ABNORMAL HIGH (ref 11.5–15.5)
WBC: 8.4 10*3/uL (ref 4.0–10.5)

## 2018-03-23 LAB — GLUCOSE, CAPILLARY
GLUCOSE-CAPILLARY: 109 mg/dL — AB (ref 65–99)
GLUCOSE-CAPILLARY: 112 mg/dL — AB (ref 65–99)
GLUCOSE-CAPILLARY: 117 mg/dL — AB (ref 65–99)
Glucose-Capillary: 111 mg/dL — ABNORMAL HIGH (ref 65–99)
Glucose-Capillary: 104 mg/dL — ABNORMAL HIGH (ref 65–99)

## 2018-03-23 LAB — PHENYTOIN LEVEL, TOTAL: PHENYTOIN LVL: 5.6 ug/mL — AB (ref 10.0–20.0)

## 2018-03-23 LAB — ALBUMIN: ALBUMIN: 1.6 g/dL — AB (ref 3.5–5.0)

## 2018-03-23 MED ORDER — PHENYTOIN 125 MG/5ML PO SUSP: 200.0000 mg | Freq: Every day | ORAL | 12 refills | Status: AC

## 2018-03-23 MED ORDER — PHENYTOIN 125 MG/5ML PO SUSP: 100.0000 mg | Freq: Two times a day (BID) | ORAL | 12 refills | Status: AC

## 2018-03-23 NOTE — Progress Notes (Signed)
Name: Joe Sandoval MRN: [id number] DOB: [birth year]    ADMISSION DATE:  03/09/2018 CONSULTATION DATE:  03/09/2018  REFERRING MD :  Dr. Clarene Sandoval  CHIEF COMPLAINT:  Hypotension/ VDRF  HISTORY OF PRESENT ILLNESS:   72 year old male with past medical history of ventilator dependent respiratory failure s/p tracheostomy, G-tube dependent, COPD, CVA with residual left hemiplegia, DM, MDRO including EBSL, MRSA, VRE, and seizures who is a resident at [facility] with state gold DNR form present.  Patient who was sent for fever, respiratory distress including agonal breathing onset today.  Patient recently finished course of vancomycin and cefepime 5 days ago.    On arrival, patient was in no respiratory distress on MV and neurologically at baseline.  Concern for sepsis given fever, tachycardia, and borderline hypotension based on measurements taken on left lower leg.  CXR showing RLL inflitrate, UTI noted, WBC 22.6, K 5.2, sCr 0.69, lactate 2.15 -> 3.1. Cultures sent.  He was started on meropenum and linezolid and started on 30 ml/kg fluid resusitation with improvement in blood pressure.  At this time, patient does not meet ICU criteria and PCCM consulted for ventilator management.    SUBJECTIVE:  No issues overnight appears comfortable on pressure support ventilation  VITAL SIGNS: Temp:  [97.4 F (36.3 C)-98.4 F (36.9 C)] 98.2 F (36.8 C) (04/08 0800) Pulse Rate:  [85-116] 108 (04/08 0800) Resp:  [16-35] 18 (04/08 0800) BP: (94-157)/(49-68) 94/49 (04/08 0800) SpO2:  [96 %-100 %] 96 % (04/08 0800) FiO2 (%):  [30 %] 30 % (04/08 0800) Weight:  [121 lb 14.6 oz (55.3 kg)] 121 lb 14.6 oz (55.3 kg) (04/08 0233)  PHYSICAL EXAMINATION: General: This is a chronically ill-appearing male currently resting on pressure support ventilation HEENT: Normocephalic.  Does have some temporal wasting.  Mucous membranes are moist he has a #6 XLT tracheostomy which is unremarkable Pulmonary: Equal chest rise on the  ventilator some scattered rhonchi appears comfortable on pressure support ventilation Cardiac: Regular rate and rhythm Abdomen: PEG tube unremarkable, ileostomy tube unremarkable, abdomen soft, positive bowel sounds. Extremities/musculoskeletal: Right AKA.  Left leg in boot.  Has left-sided hemiparesis. Neuro: Localizes some, otherwise unresponsive   General: chronically ill appearing male, NAD on vent  HEENT: mm moist, trach site clean Pulmonary: resps even non labored on PS 10/5, coarse Cardiac: Regular rate and rhythm Abdomen: Soft nontender Extremities: Contracted, right AKA. Warm and dry  Skin stage IV decub to bilateral glutes, hip and sacrum Neuro: Awake, eyes open, tracks but noninteractive   Recent Labs  Lab 03/18/18 0419 03/20/18 0723 03/23/18 0343  NA 137 135 135  K 4.2 4.2 3.9  CL 100* 101 99*  CO2 27 27 27   BUN 14 21* 23*  CREATININE 0.34* 0.36* 0.42*  GLUCOSE 118* 129* 114*   Recent Labs  Lab 03/18/18 0420 03/20/18 0722 03/23/18 0343  HGB 10.5* 10.9* 9.8*  HCT 34.6* 36.1* 32.6*  WBC 8.6 9.4 8.4  PLT 296 315 403*   No results found. SIGNIFICANT EVENTS  3/25 Admitted  STUDIES:  CXR 3/25 >> mild hyperinflation; patchy atelectasis or infiltrate at right lung base  CULTURES:  3/25 BC x2 >>NEG 3/25 UC >>neg 3/25 trach aspirate >>cancelled   ANTIBIOTICS: pta Vanc and cefepime 3/25 linezolid >>4/2 3/25 meropenem >>4/2  Lines/drains: (all from outside facility) DL R PICC Foley Shiley 6 trach XLT distal Gtube Ostomy  PIV x 1   BRIEF PATIENT DESCRIPTION:  52 yoM, w/PMH of DNR, VDRF/ trach/ MDRO, Gtube dependent with  recent treatment for HCAP sent from [facility] with fever, agonal ?on MV with respiratory distress, presented to ER in no distress, at baseline mental status with soft BP (taken on lower extremity), tachycardic, lactate of 2.15-> 3.1 with concern for Sepsis found to have RLL PNA, UTI, started on 9630ml/kg fluid bolus with improvement in BP  to baseline and on LUE.    No significant changes ready to go back to [facility].   ASSESSMENT / PLAN:  Chronic respiratory failure with tracheostomy and VDRF HCAP- hx of MDS, s/p course of vanc and cefepime at [facility] COPD Seizure History of CVA with left hemiplegia Dysphasia with protein calorie malnutrition, PEG tube dependent Decubitus ulcer Diabetes Hypertension..   Discussion 72 year old male patient who [facility] with report of fever, and change in respiratory status on vent.  Admitted with a working diagnosis of sepsis, right lower lobe pneumonia, infected sacral decub and urinary tract infection.  He has a history of multi-organism infections including staph hemolyticus from blood, as well as Pseudomonas and Proteus in sputum.  He is now completed antibiotic therapy and [facility]  Plan/recommendation Trend fever curve Continue supportive medical therapies Daily trach collar cycling however I do not think he can come off the ventilator Stable [facility]  Simonne MartinetPeter E Isiac Sandoval ACNP-BC [facility] Pager # [phone] OR # [phone] if no answer'

## 2018-03-23 NOTE — Progress Notes (Addendum)
Patient will discharge to [facility] Rm [id number] Anticipated discharge date: 4/8 Family notified: left message for son [facility] scheduled for [facility]30[facility] Report #: [phone]  Med necessity and [facility] transfer report on chart- RN to complete EMTALA for [facility]  CSW signing off.  Burna SisJenna H. Jayani Rozman, LCSW Clinical Social Worker [phone]

## 2018-03-23 NOTE — Discharge Summary (Signed)
Physician Discharge Summary  Joe Sandoval ZOX:[id number] DOB: 1946-10-29 DOA: 71/25/2019  PCP: Crist Fat, MD  Admit date: 03/09/2018 Discharge date: 03/23/2018  Admitted From: [facility]  Disposition:  [facility]  Recommendations for Outpatient Follow-up:  1. We have been unable to contact family for goals of care discussions 2. Pharmacy to manage Dilantin please 3. Wound care to follow extensive decubitus ulcers    Discharge Condition:  stable   CODE STATUS: Partial code- can use BiPAP and Intubate if needed Diet recommendation:  Tube feeds ordered Consultations:  PCCM   Discharge Diagnoses:  Principal Problem:   Sepsis (HCC)   Acute on chronic respiratory failure with hypoxia (HCC)   HCAP (healthcare-associated pneumonia)   Ventilator dependent (HCC)   Seizure disorder(HCC)   Extensive Decubitus ulcers   PEG tube, chronic foley, ileostomy   Right LE stump  Subjective: Non-verbal- does not follow commands.  Brief Summary: Joe Sandoval is a complicated 72 y.o.malecoming from [facility] withhistory of seizures, ventilator dependent respiratory failure, tracheostomy PEG tube placement colostomy, history of ESBL infection, COPD, CVA with right-sided weakness, right lower extremity amputation multiple sacral decubitus ulcers was brought to the ER after patient was found to be febrile with respiratory distress and hypoxia.  Had finished a recent course of antibiotics vancomycin and cefepime 5 days previously [2/16 had BC + for S hemolyticus and sputum cult- Pseudomonas, and proteus mirabilis]  Hospital Course:  Principal Problem:   Sepsis     Acute on chronic respiratory failure with hypoxia  -   Ventilator dependent     HCAP (healthcare-associated pneumonia) - completed a 7 day course of Zovox and Meropenem on 3/31 - continues to be afebrile - PCCM has been following intermittently for trach and vent management     Seizure on 4/1 - H/o CVA with residual left  hemiplegia and 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 -  seizures noted 4/1~15:00- -Increased Dilantin - pharmacy has been managing  - no further apparent Sz -continue Keppra 1.5 every 12 per home dose  PEG tube, chronic foley, ileostomy - cont tube feeds and free water- following I and O - keep even    Decubitus ulcers - multiple on sacrum/ buttocks - cont wound care  DM2 - Q 4 SSI  HTN  - Lopressor- BP stable  Discharge Exam: Vitals:   03/23/18 0900 03/23/18 1000  BP: (!) 109/54 (!) 135/59  Pulse: (!) 102 (!) 107  Resp: 19 19  Temp:    SpO2: 100% 99%   Vitals:   03/23/18 0725 03/23/18 0800 03/23/18 0900 03/23/18 1000  BP: (!) 100/52 (!) 94/49 (!) 109/54 (!) 135/59  Pulse: (!) 107 (!) 108 (!) 102 (!) 107  Resp: (!) 22 18 19 19   Temp:  98.2 F (36.8 C)    TempSrc:  Oral    SpO2: 100% 96% 100% 99%  Weight:      Height:        General: eyes closed, awakens at times, non-verbal, no purposeful movements and does not track and does not follow commands' Neck: trach in place Cardiovascular: RRR, S1/S2 +  Respiratory: CTA bilaterally, no wheezing, no rhonchi Abdominal: Soft, NT, ND, bowel sounds +, PEG intact, ileostomy present GU: foley cath with clear urine Extremities: no edema, no cyanosis- right LE stump noted to be clean   Discharge Instructions   Allergies as of 03/23/2018      Reactions   Iodine Other (See Comments)   Unknown reaction (listed on [facility] paperwork)  Shellfish Allergy Other (See Comments)   Unknown reaction (listed on [facility] Paperwork      Medication List    STOP taking these medications   chlorhexidine 0.12 % solution Commonly known as:  PERIDEX   NORMAL SALINE FLUSH IV   nystatin powder Generic drug:  nystatin   polyethylene glycol packet Commonly known as:  MIRALAX / GLYCOLAX     TAKE these medications   acetaminophen 325 MG tablet Commonly known as:  TYLENOL Place 650 mg into feeding tube every 4 (four) hours as needed for  fever (pain).   dextrose 50 % solution Inject 12.5 g into the vein See admin instructions. Use 12.5 gram intravenously as needed for BS below 70 and patient has iv access   Dextrose-Fructose-Sod Citrate [id number] MG Chew 1 tablet by PEG Tube route as needed (BS <70).   eucerin cream Apply 1 application topically See admin instructions. Apply topically to dry skin every shift   feeding supplement (PIVOT 1.5 CAL) Liqd Place 1,000 mLs into feeding tube continuous.   FLORA-Q Caps capsule 1 capsule daily.   FOLIC ACID PO 5 mg by PEG Tube route daily. 5 mg tablets   free water Soln Place 200 mLs into feeding tube every 8 (eight) hours.   GLUCAGON EMERGENCY 1 MG injection Generic drug:  glucagon Inject 1 mg into the muscle as needed (BS <70 (use if patient cannot swallow)).   HYPER-SAL 7 % Nebu Generic drug:  Sodium Chloride (Inhalant) Inhale 1 vial into the lungs every 6 (six) hours.   insulin lispro 100 UNIT/ML injection Commonly known as:  HUMALOG Inject 0-10 Units into the skin every 6 (six) hours. Per sliding scale: CBG 15-200 2 units, 201-250, 251-300 6 units, 301-350 8 units, 351-400 10 units   ipratropium-albuterol 0.5-2.5 (3) MG/3ML Soln Commonly known as:  DUONEB Take 3 mLs by nebulization every 6 (six) hours.   levETIRAcetam 100 MG/ML solution Commonly known as:  KEPPRA Place 15 mLs (1,500 mg total) into feeding tube 2 (two) times daily. For seizures   levothyroxine 50 MCG tablet Commonly known as:  SYNTHROID, LEVOTHROID Place 50 mcg into feeding tube daily.   LORazepam 2 MG tablet Commonly known as:  ATIVAN Place 2 mg into feeding tube every 4 (four) hours as needed for seizure.   LOVENOX IJ Inject 50 mg into the skin every 12 (twelve) hours.   metoprolol tartrate 50 MG tablet Commonly known as:  LOPRESSOR Place 50 mg into feeding tube 2 (two) times daily. Hold for BP less than 100   morphine 20 MG/5ML solution Place 4 mg into feeding tube every 4  (four) hours as needed for pain (agitation).   multivitamin with minerals Tabs tablet Place 1 tablet into feeding tube daily.   NEUTROGENA T/GEL 4 % Sham Generic drug:  Coal Tar Extract Apply 1 application topically See admin instructions. Apply to head topically at bedtime every Wednesday and Sunday (wash hair with bath)   ondansetron 4 MG tablet Commonly known as:  ZOFRAN Place 4 mg into feeding tube every 4 (four) hours as needed for nausea or vomiting.   phenytoin 125 MG/5ML suspension Commonly known as:  DILANTIN Place 4 mLs (100 mg total) into feeding tube every 8 (eight) hours. For seizures What changed:  Another medication with the same name was added. Make sure you understand how and when to take each.   phenytoin 125 MG/5ML suspension Commonly known as:  DILANTIN Place 4 mLs (100 mg total) into feeding tube  2 (two) times daily. What changed:  You were already taking a medication with the same name, and this prescription was added. Make sure you understand how and when to take each.   phenytoin 125 MG/5ML suspension Commonly known as:  DILANTIN Place 8 mLs (200 mg total) into feeding tube daily at 2 PM. What changed:  You were already taking a medication with the same name, and this prescription was added. Make sure you understand how and when to take each.   potassium phosphate (monobasic) 500 MG tablet Commonly known as:  K-PHOS ORIGINAL Take 1 tablet (500 mg total) by mouth 4 (four) times daily -  with meals and at bedtime.   PROTONIX 40 mg/20 mL Pack Generic drug:  pantoprazole sodium Place 40 mg into feeding tube daily.   QUEtiapine 25 MG tablet Commonly known as:  SEROQUEL Place 25 mg into feeding tube 2 (two) times daily. For agitation and restlessness   traMADol 50 MG tablet Commonly known as:  ULTRAM Place 50 mg into feeding tube every 6 (six) hours as needed (pain 4-6).       Allergies  Allergen Reactions  . Iodine Other (See Comments)    Unknown  reaction (listed on [facility] paperwork)  . Shellfish Allergy Other (See Comments)    Unknown reaction (listed on [facility] Paperwork     Procedures/Studies:    Ct Abdomen Pelvis Wo Contrast  Result Date: 03/10/2018 CLINICAL DATA:  72 year old male with abdominal pain and distention. Fever. Concern for abscess. EXAM: CT ABDOMEN AND PELVIS WITHOUT CONTRAST TECHNIQUE: Multidetector CT imaging of the abdomen and pelvis was performed following the standard protocol without IV contrast. COMPARISON:  None. FINDINGS: Evaluation of this exam is limited in the absence of intravenous contrast. Lower chest: There are emphysematous changes of the lung bases with diffuse interstitial coarsening. There is mucous plugging of the bilateral lower lobe bronchi with areas of consolidative changes of the lung bases concerning for pneumonia. Clinical correlation is recommended. There is no intra-abdominal free air or free fluid. Hepatobiliary: The liver is unremarkable. The gallbladder is unremarkable. Pancreas: Unremarkable. No pancreatic ductal dilatation or surrounding inflammatory changes. Spleen: Normal in size without focal abnormality. Adrenals/Urinary Tract: Minimal left adrenal thickening. The right adrenal gland is unremarkable. The kidneys, and visualized ureters appear unremarkable. The urinary bladder is partially distended. A Foley catheter is noted within the bladder. There is apparent diffuse thickening of the bladder wall which may be partly related to underdistention. Cystitis is not excluded. Correlation with urinalysis recommended. Stomach/Bowel: There is a percutaneous gastrostomy with tip and balloon in the body of the stomach. Contrast injected via the catheter opacifies the stomach. No extraluminal spillage of contrast. There is no bowel obstruction or active inflammation. A left lower quadrant ileostomy is noted. There is postsurgical changes of partial colon resection. Vascular/Lymphatic: Advanced  aortoiliac atherosclerotic disease. A vascular stent graft extends from the right common iliac artery to the right common femoral artery. Evaluation of the vasculature is limited in the absence of intravenous contrast. A 1.9 x 1.5 cm ovoid density along the right common femoral vessels (series 3, image 48) is not well characterized but may represent an enlarged lymph node, a vascular aneurysm, or a retroperitoneal soft tissue lesion/metastatic nodule/lymph node. A smaller nodular density noted more superiorly on series 3, image 40. Reproductive: The prostate and seminal vesicles are grossly unremarkable as visualized. Other: Midline vertical anterior abdominal wall incisional scar. Mild diffuse subcutaneous edema. Musculoskeletal: Osteopenia with degenerative changes of the  spine. Multiple sclerotic lesions involving the spine with the largest involving L2-1 and L2 most consistent with metastatic disease. Clinical correlation is recommended. No acute fracture. Decubitus ulcer over the left greater trochanter and ischial tuberosities. No fluid collection or abscess. Old healed right femoral fracture and partially visualized right femoral fixation hardware. Heterotopic ossification of the soft tissues about the left greater trochanter likely related to chronic infection. IMPRESSION: 1. Mucous impaction in the lower lobe bronchi with findings concerning for bilateral lower lobe pneumonia. Clinical correlation is recommended. 2. Postsurgical changes of the bowel. No evidence of bowel obstruction. No fluid collection or abscess. 3.  Aortic Atherosclerosis (ICD10-I70.0). 4. Right iliac artery endovascular stent graft. 5. Ovoid soft tissue densities along the spine to the right of the aorta concerning for lymphadenopathy. Clinical correlation is recommended. 6. Osseous metastatic disease. Electronically Signed   By: Elgie CollardArash  Radparvar M.D.   On: 03/10/2018 03:50   Dg Chest Port 1 View  Result Date: 03/16/2018 CLINICAL  DATA:  Recent pneumonia EXAM: PORTABLE CHEST 1 VIEW COMPARISON:  March 15, 2018 and March 14, 2018 FINDINGS: Tracheostomy catheter tip is 5.5 cm above the carina. Central catheter tip is in the superior vena cava. No pneumothorax. There is atelectatic change in the lung bases with small pleural effusions bilaterally. There is no appreciable airspace consolidation. Heart is upper normal in size with pulmonary vascularity within normal limits. No adenopathy. No bone lesions. IMPRESSION: Tube and catheter positions as described without pneumothorax. Small pleural effusions bilaterally with bibasilar atelectasis. Stable cardiac silhouette. No frank consolidation. Electronically Signed   By: Bretta BangWilliam  Woodruff III M.D.   On: 03/16/2018 07:44   Dg Chest Port 1 View  Result Date: 03/15/2018 CLINICAL DATA:  Follow-up pneumonia. EXAM: PORTABLE CHEST 1 VIEW COMPARISON:  03/14/2018 FINDINGS: The right arm PICC line tip is in the projection of the distal SVC. There is a tracheostomy tube with tip above the carina. Normal heart size. Diffuse lower lobe predominant coarsened interstitial opacities throughout both lungs are unchanged compared with previous exam. IMPRESSION: 1. Stable support apparatus. 2. No change in aeration to the lungs compared with previous exam. Electronically Signed   By: Signa Kellaylor  Stroud M.D.   On: 03/15/2018 07:25   Dg Chest Port 1 View  Result Date: 03/15/2018 CLINICAL DATA:  Pneumonia. EXAM: PORTABLE CHEST 1 VIEW COMPARISON:  Chest radiograph March 12, 2018 FINDINGS: Cardiac silhouette is mildly enlarged and unchanged. Mediastinal silhouette is nonsuspicious. RIGHT PICC distal tip projects in mid severe vena cava. Tracheostomy tube in situ. Small LEFT greater than RIGHT pleural effusions. Diffuse interstitial prominence, confluent in the lung bases. No pneumothorax. Osteopenia. IMPRESSION: Similar interstitial prominence confluent in the lung bases with small pleural effusions. No apparent change  in life-support lines. Electronically Signed   By: Awilda Metroourtnay  Bloomer M.D.   On: 03/15/2018 03:04   Dg Chest Port 1 View  Result Date: 03/12/2018 CLINICAL DATA:  Pneumonia. EXAM: PORTABLE CHEST 1 VIEW COMPARISON:  Radiograph of March 10, 2018. FINDINGS: Stable cardiomediastinal silhouette and central pulmonary vascular congestion. Tracheostomy tube is unchanged in position. No pneumothorax is noted. Stable bilateral interstitial lung densities are noted concerning for scarring or possibly edema. Mild bilateral pleural effusions are noted. Bony thorax is unremarkable. Right-sided PICC line is noted with distal tip in expected position of the SVC. IMPRESSION: Stable bilateral diffuse interstitial lung densities are noted which may represent scarring or edema. Mild bilateral pleural effusions are noted. Electronically Signed   By: Lupita RaiderJames  Green Jr, M.D.  On: 03/12/2018 15:16   Dg Chest Port 1 View  Result Date: 03/10/2018 CLINICAL DATA:  73 year old male with shortness of breath. EXAM: PORTABLE CHEST 1 VIEW COMPARISON:  Chest radiograph dated 03/09/2018 FINDINGS: Tracheostomy remains above the carina. Right-sided PICC in similar positioning. There is hyperinflation of the lungs with right lung base atelectasis versus scarring. Pneumonia is not excluded. A small right pleural effusion may be present. There is diffuse interstitial coarsening and bronchitic changes. No pneumothorax. The cardiac silhouette is within normal limits. No acute osseous pathology. IMPRESSION: Right lung base atelectasis/scarring versus infiltrate. Probable small right pleural effusion. Electronically Signed   By: Elgie Collard M.D.   On: 03/10/2018 05:06   Dg Chest Port 1 View  Result Date: 03/09/2018 CLINICAL DATA:  Recent pneumonia, sepsis EXAM: PORTABLE CHEST 1 VIEW COMPARISON:  None. FINDINGS: Endotracheal tube 5.8 cm above the carina. Right PICC line tip in the SVC. Heart is normal size. Mild hyperinflation of the lungs.  Patchy opacity at the right lung base. Left lung clear. No effusions. No acute bony abnormality. IMPRESSION: Mild hyperinflation. Patchy atelectasis or infiltrate at the right lung base. Electronically Signed   By: Charlett Nose M.D.   On: 03/09/2018 18:44     The results of significant diagnostics from this hospitalization (including imaging, microbiology, ancillary and laboratory) are listed below for reference.     Microbiology: No results found for this or any previous visit (from the past 240 hour(s)).   Labs: BNP (last 3 results) No results for input(s): BNP in the last 8760 hours. Basic Metabolic Panel: Recent Labs  Lab 03/18/18 0419 03/18/18 0420 03/20/18 0723 03/23/18 0343  NA 137  --  135 135  K 4.2  --  4.2 3.9  CL 100*  --  101 99*  CO2 27  --  27 27  GLUCOSE 118*  --  129* 114*  BUN 14  --  21* 23*  CREATININE 0.34*  --  0.36* 0.42*  CALCIUM 8.3*  --  8.6* 8.6*  MG  --  1.8  --   --   PHOS 3.6  --   --   --    Liver Function Tests: Recent Labs  Lab 03/18/18 0419 03/19/18 0537 03/23/18 0343  ALBUMIN 1.5* 1.6* 1.6*   No results for input(s): LIPASE, AMYLASE in the last 168 hours. No results for input(s): AMMONIA in the last 168 hours. CBC: Recent Labs  Lab 03/18/18 0420 03/20/18 0722 03/23/18 0343  WBC 8.6 9.4 8.4  NEUTROABS 5.2  --   --   HGB 10.5* 10.9* 9.8*  HCT 34.6* 36.1* 32.6*  MCV 91.1 92.3 91.8  PLT 296 315 403*   Cardiac Enzymes: No results for input(s): CKTOTAL, CKMB, CKMBINDEX, TROPONINI in the last 168 hours. BNP: Invalid input(s): POCBNP CBG: Recent Labs  Lab 03/22/18 1545 03/22/18 2019 03/23/18 0025 03/23/18 0440 03/23/18 0805  GLUCAP 101* 95 109* 117* 111*   D-Dimer No results for input(s): DDIMER in the last 72 hours. Hgb A1c No results for input(s): HGBA1C in the last 72 hours. Lipid Profile No results for input(s): CHOL, HDL, LDLCALC, TRIG, CHOLHDL, LDLDIRECT in the last 72 hours. Thyroid function studies No results  for input(s): TSH, T4TOTAL, T3FREE, THYROIDAB in the last 72 hours.  Invalid input(s): FREET3 Anemia work up No results for input(s): VITAMINB12, FOLATE, FERRITIN, TIBC, IRON, RETICCTPCT in the last 72 hours. Urinalysis    Component Value Date/Time   COLORURINE AMBER (A) 03/09/2018 1831   APPEARANCEUR  TURBID (A) 03/09/2018 1831   LABSPEC 1.021 03/09/2018 1831   PHURINE 5.0 03/09/2018 1831   GLUCOSEU NEGATIVE 03/09/2018 1831   HGBUR MODERATE (A) 03/09/2018 1831   BILIRUBINUR NEGATIVE 03/09/2018 1831   KETONESUR NEGATIVE 03/09/2018 1831   PROTEINUR 100 (A) 03/09/2018 1831   NITRITE NEGATIVE 03/09/2018 1831   LEUKOCYTESUR LARGE (A) 03/09/2018 1831   Sepsis Labs Invalid input(s): PROCALCITONIN,  WBC,  LACTICIDVEN Microbiology No results found for this or any previous visit (from the past 240 hour(s)).   Time coordinating discharge: 65  SIGNED:   Calvert Cantor, MD  [facility] 03/23/2018, 11:09 AM Pager   If 7PM-7AM, please contact night-coverage [email or web address] Password [email or web address]

## 2018-03-23 NOTE — Progress Notes (Signed)
MEDICATION RELATED CONSULT NOTE - Follow-Up  Pharmacy Consult for Phenytoin Indication: Seizures  Allergies  Allergen Reactions  . Iodine Other (See Comments)    Unknown reaction (listed on [facility] paperwork)  . Shellfish Allergy Other (See Comments)    Unknown reaction (listed on [facility] Paperwork    Patient Measurements: Height: 6' (182.9 cm) Weight: 121 lb 14.6 oz (55.3 kg) IBW/kg (Calculated) : 77.6 Vital Signs: Temp: 98.2 F (36.8 C) (04/08 0800) Temp Source: Oral (04/08 0800) BP: 135/59 (04/08 1000) Pulse Rate: 107 (04/08 1000) Intake/Output from previous day: 04/07 0701 - 04/08 0700 In: 1675 [I.V.:20; NG/GT:1500] Out: 1325 [Urine:1200; Stool:125] Intake/Output from this shift: Total I/O In: 157.5 [NG/GT:157.5] Out: 135 [Urine:135]  Labs: Recent Labs    03/23/18 0343  WBC 8.4  HGB 9.8*  HCT 32.6*  PLT 403*  CREATININE 0.42*  ALBUMIN 1.6*   Estimated Creatinine Clearance: 66.2 mL/min (A) (by C-G formula based on SCr of 0.42 mg/dL (L)).  Assessment: 72 year old male with PEG and history of seizures on Keppra and Phenytoin per tube. Pharmacy consulted to help with Phenytoin dosing.   The patient remains stable on the current dosing of phenytoin without seizures activity noted. The phenytoin dose was adjusted on 4/2. Phenytoin level today corrects to 13.33 with albumin of 1.6 - therapeutic and this is a steady state level.   Goal of Therapy:  Corrected Phenytoin level 10 to 20 mg/L  Plan:  Continue Phenytoin 100 mg qam, 200 mg at 1400, 100 mg hs Will continue to monitor for seizure activity Monitor for any changes in renal function for need to repeat level.   Thank you for allowing pharmacy to be a part of this patient's care.  Joe Sandoval, PharmD, BCPS, BCCCP Clinical Pharmacist Clinical phone 03/23/2018 until 3:30PM [phone] After hours, please call #[phone] 03/23/2018 10:13 AM

## 2018-04-15 DEATH — deceased

## 2019-05-27 IMAGING — DX DG CHEST 1V PORT
1 series · 1 of 1 positions shown · non-contrast
Comparison: Chest radiograph dated 03/09/2018

CLINICAL DATA: 72-year-old male with shortness of breath.

EXAM:
PORTABLE CHEST 1 VIEW

[chest]
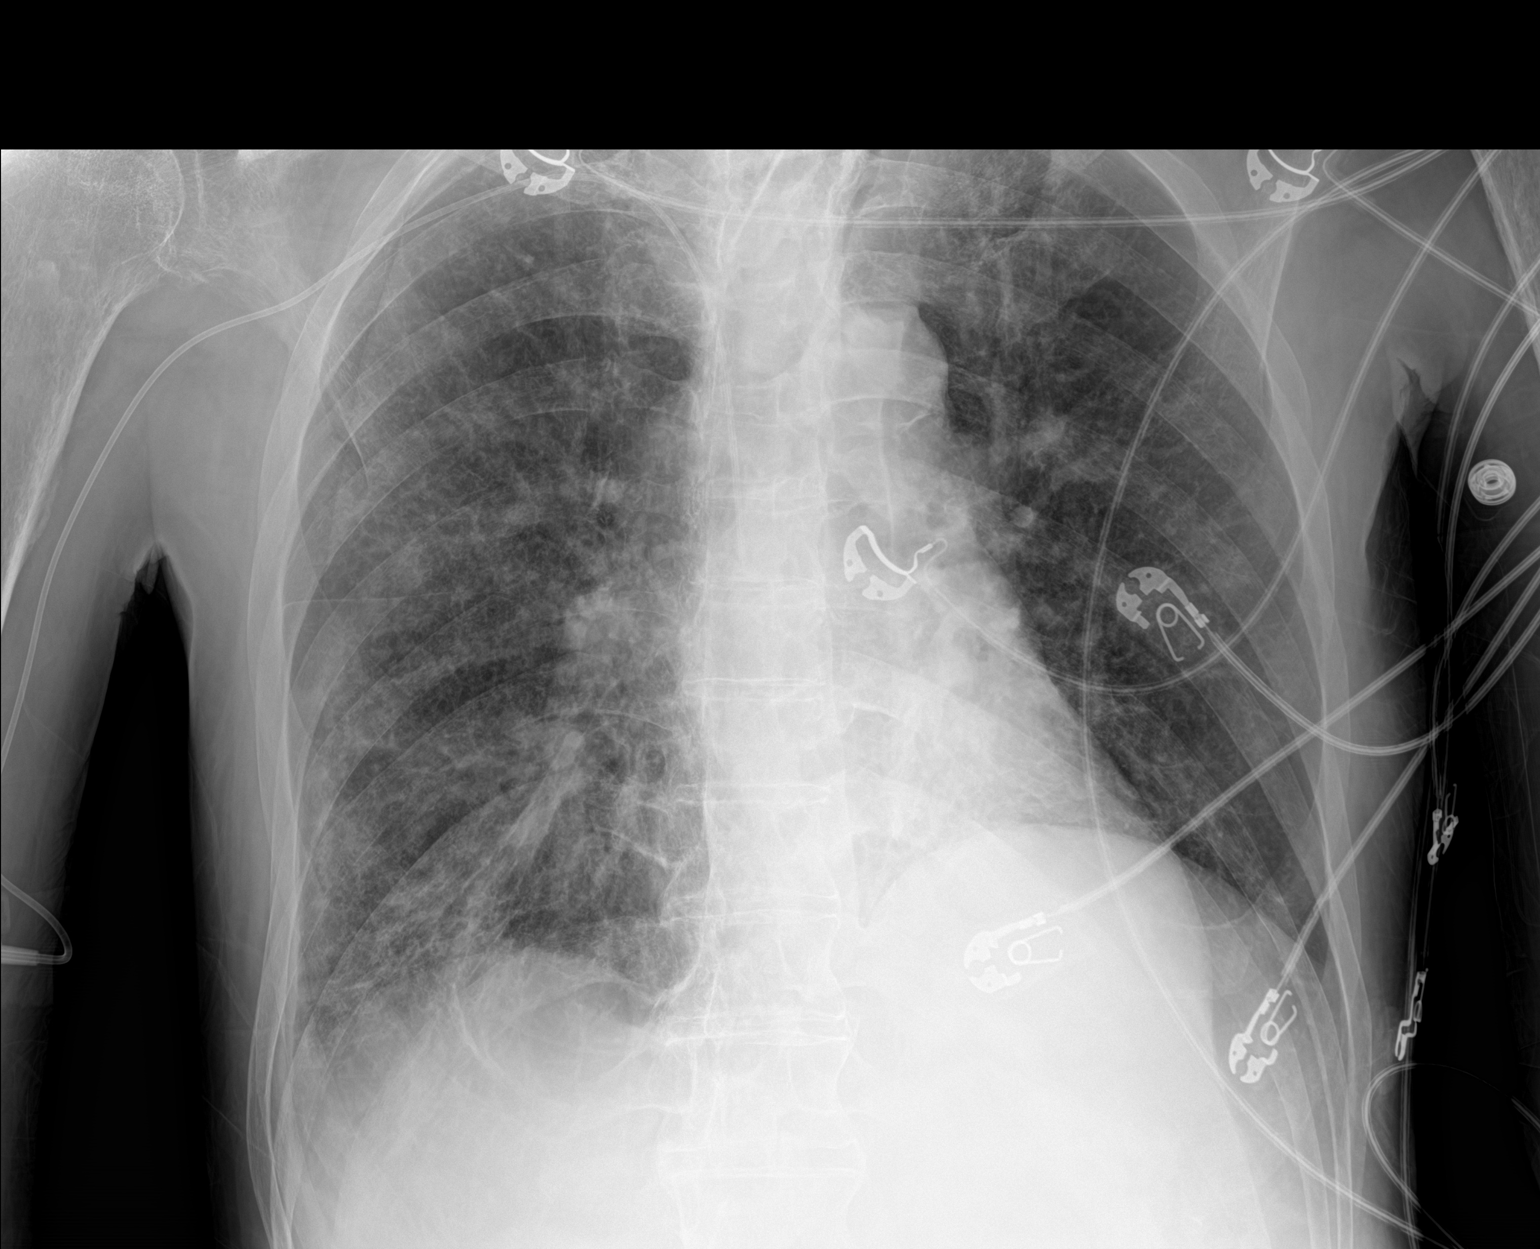

[1 of 1 positions shown; findings below may reference images not displayed]

FINDINGS: Tracheostomy remains above the carina. Right-sided PICC in similar
positioning. There is hyperinflation of the lungs with right lung
base atelectasis versus scarring. Pneumonia is not excluded. A small
right pleural effusion may be present. There is diffuse interstitial
coarsening and bronchitic changes. No pneumothorax. The cardiac
silhouette is within normal limits. No acute osseous pathology.
IMPRESSION: Right lung base atelectasis/scarring versus infiltrate. Probable
small right pleural effusion.

## 2019-05-27 IMAGING — CT CT ABD-PELV W/O CM
2 of 4 series · 14 of 46 positions shown, 16 images · non-contrast
Comparison: None.

CLINICAL DATA: 72-year-old male with abdominal pain and distention.
Fever. Concern for abscess.

EXAM:
CT ABDOMEN AND PELVIS WITHOUT CONTRAST
TECHNIQUE: Multidetector CT imaging of the abdomen and pelvis was performed
following the standard protocol without IV contrast.

[Series 3: a/p w/o 5mm · axial · non-contrast · 0.83mm/px · z∈[+743,+1183]mm · 11 of 104 slices shown, 13 images]
[im 8/104  soft-tissue]
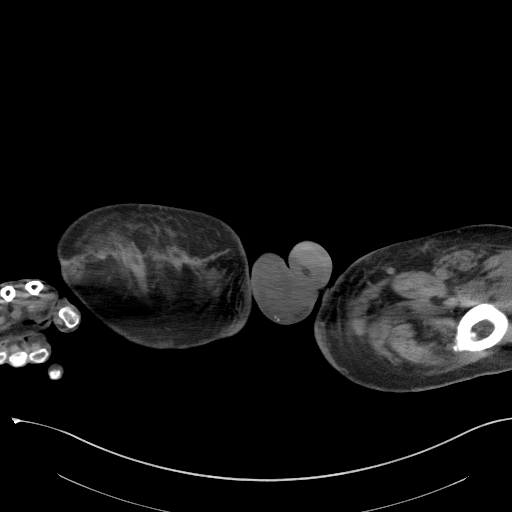
[im 8/104  bone]
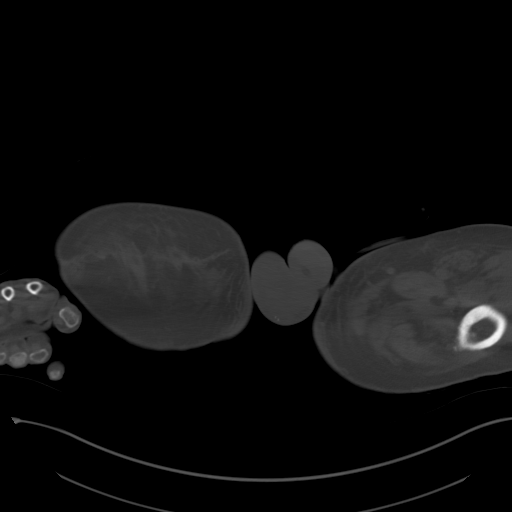
[im 16/104  soft-tissue]
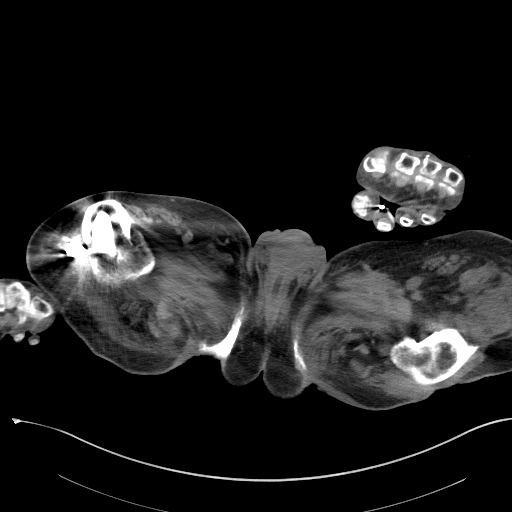
[im 24/104  soft-tissue]
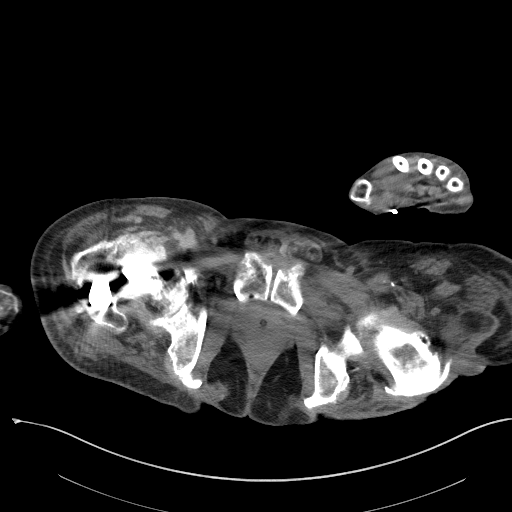
[im 32/104  soft-tissue]
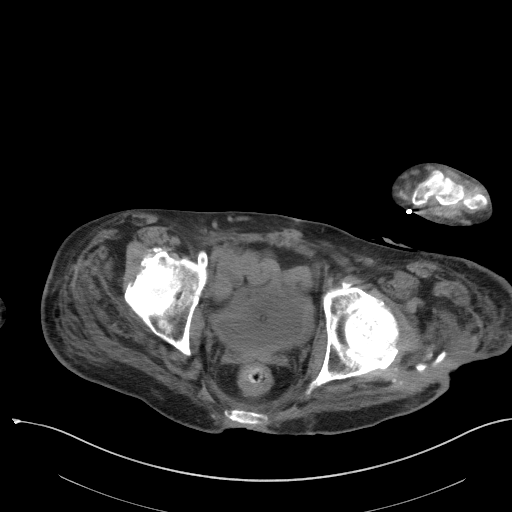
[im 44/104  soft-tissue]
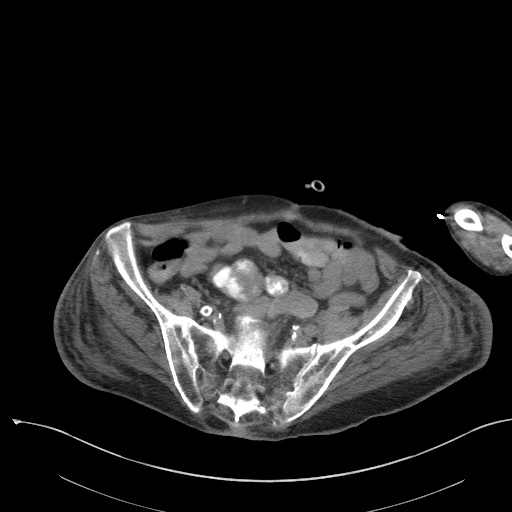
[im 52/104  soft-tissue]
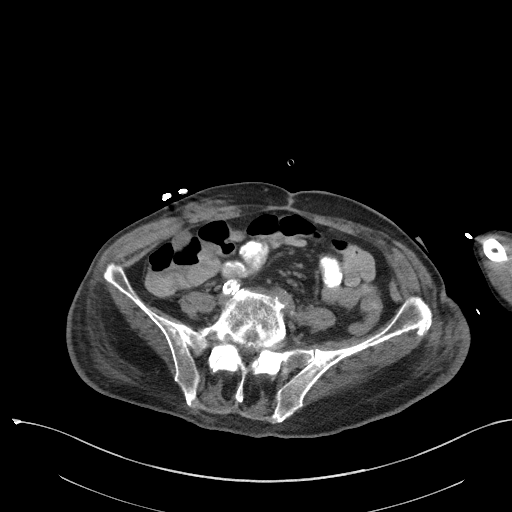
[im 60/104  soft-tissue]
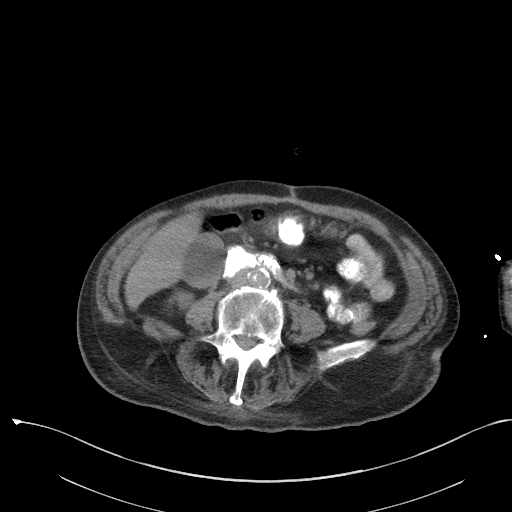
[im 72/104  soft-tissue]
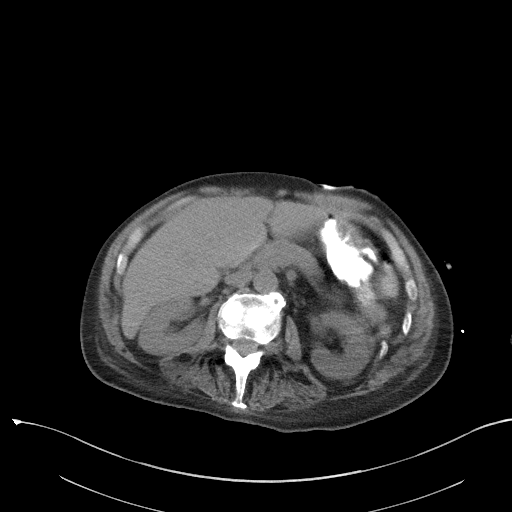
[im 80/104  soft-tissue]
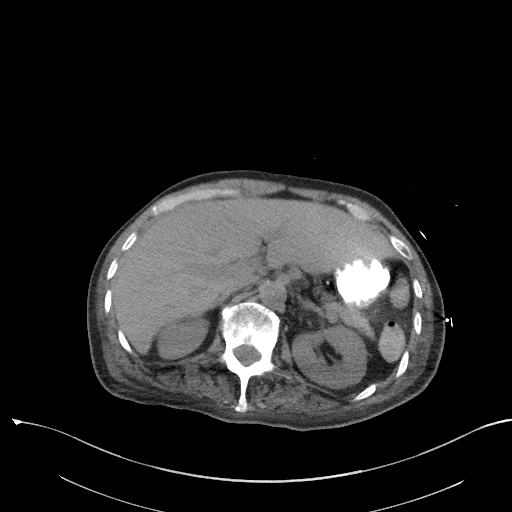
[im 80/104  bone]
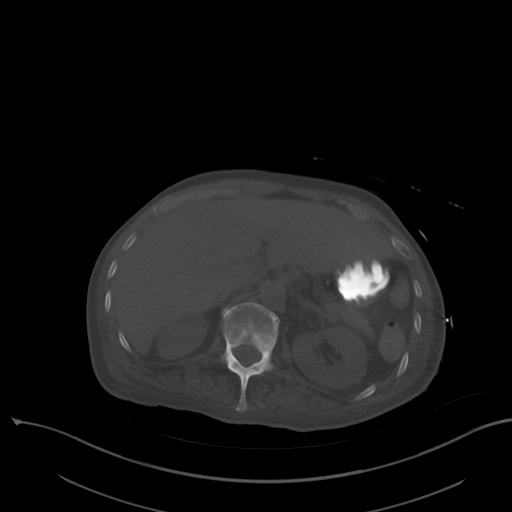
[im 88/104  soft-tissue]
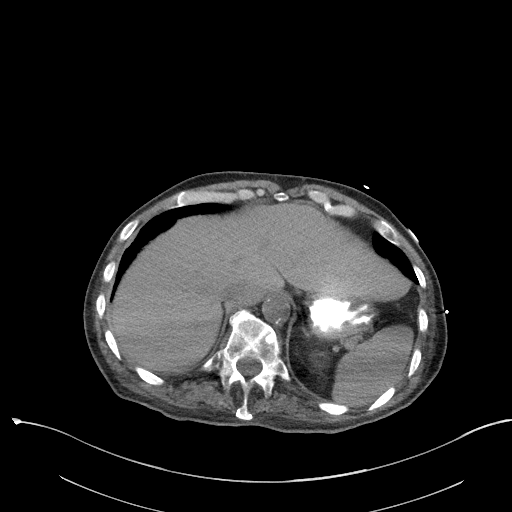
[im 96/104  soft-tissue]
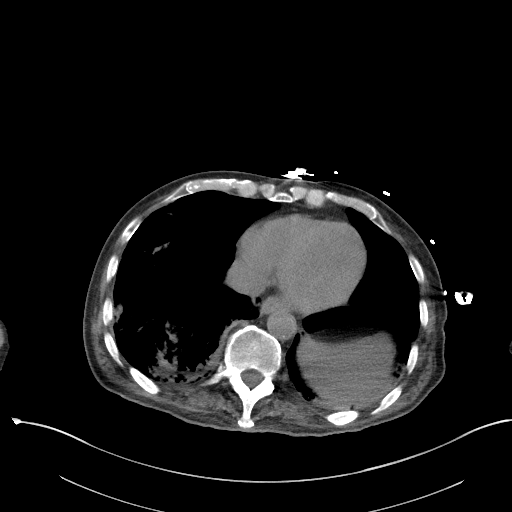

[Series 6: a/p w/o cor · coronal · non-contrast · 0.69mm/px · 3 of 113 slices shown]
[im 38/113  soft-tissue]
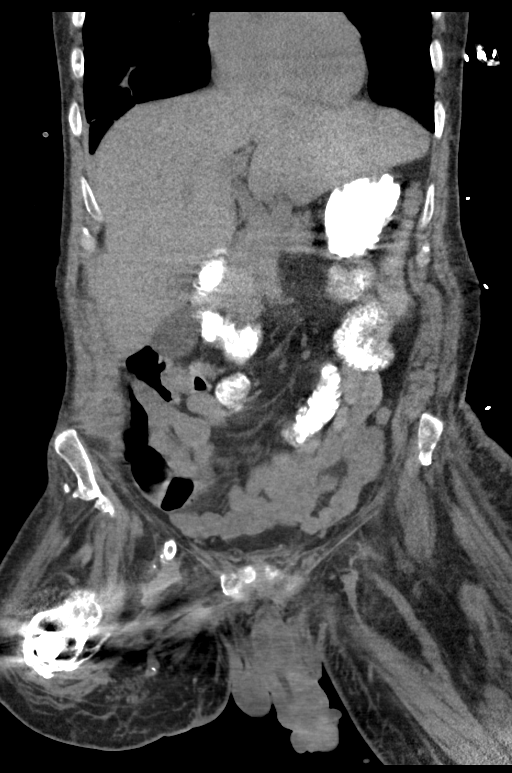
[im 50/113  soft-tissue]
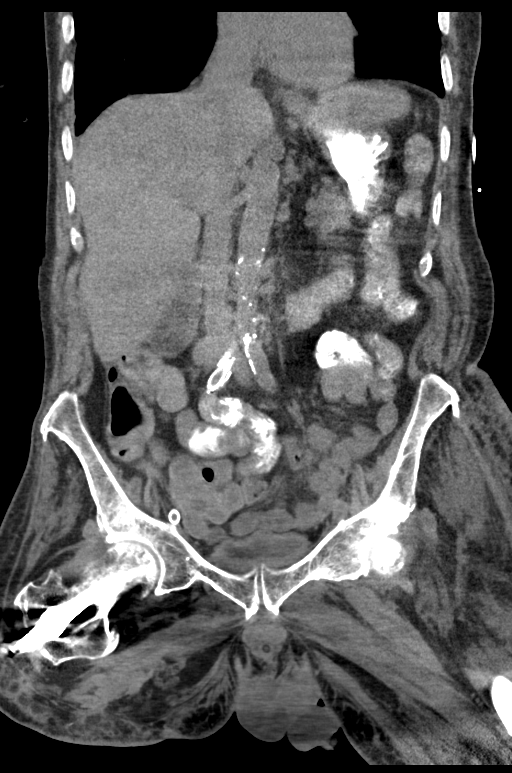
[im 63/113  soft-tissue]
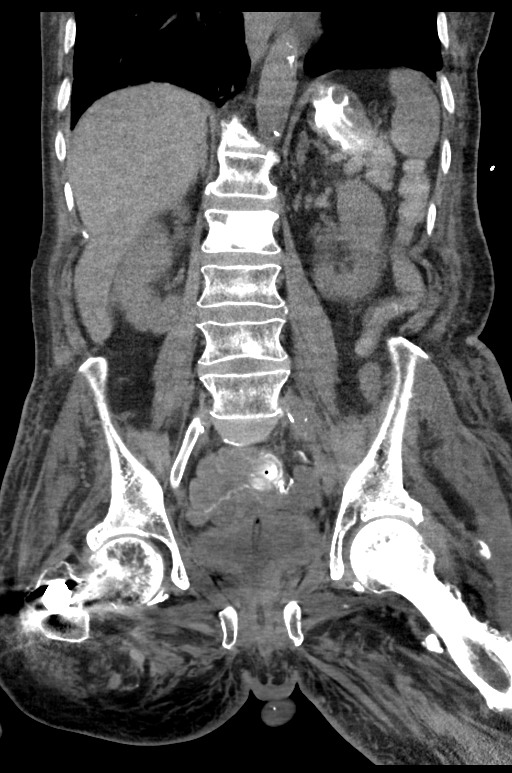

[14 of 46 positions shown; findings below may reference images not displayed]

FINDINGS: Evaluation of this exam is limited in the absence of intravenous
contrast.

Lower chest: There are emphysematous changes of the lung bases with
diffuse interstitial coarsening. There is mucous plugging of the
bilateral lower lobe bronchi with areas of consolidative changes of
the lung bases concerning for pneumonia. Clinical correlation is
recommended.

There is no intra-abdominal free air or free fluid.

Hepatobiliary: The liver is unremarkable. The gallbladder is
unremarkable.

Pancreas: Unremarkable. No pancreatic ductal dilatation or
surrounding inflammatory changes.

Spleen: Normal in size without focal abnormality.

Adrenals/Urinary Tract: Minimal left adrenal thickening. The right
adrenal gland is unremarkable. The kidneys, and visualized ureters
appear unremarkable. The urinary bladder is partially distended. A
Foley catheter is noted within the bladder. There is apparent
diffuse thickening of the bladder wall which may be partly related
to underdistention. Cystitis is not excluded. Correlation with
urinalysis recommended.

Stomach/Bowel: There is a percutaneous gastrostomy with tip and
balloon in the body of the stomach. Contrast injected via the
catheter opacifies the stomach. No extraluminal spillage of
contrast. There is no bowel obstruction or active inflammation. A
left lower quadrant ileostomy is noted. There is postsurgical
changes of partial colon resection.

Vascular/Lymphatic: Advanced aortoiliac atherosclerotic disease. A
vascular stent graft extends from the right common iliac artery to
the right common femoral artery. Evaluation of the vasculature is
limited in the absence of intravenous contrast. A 1.9 x 1.5 cm ovoid
density along the right common femoral vessels (series 3, image 48)
is not well characterized but may represent an enlarged lymph node,
a vascular aneurysm, or a retroperitoneal soft tissue
lesion/metastatic nodule/lymph node. A smaller nodular density noted
more superiorly on series 3, image 40.

Reproductive: The prostate and seminal vesicles are grossly
unremarkable as visualized.

Other: Midline vertical anterior abdominal wall incisional scar.
Mild diffuse subcutaneous edema.

Musculoskeletal: Osteopenia with degenerative changes of the spine.
Multiple sclerotic lesions involving the spine with the largest
involving L2-1 and L2 most consistent with metastatic disease.
Clinical correlation is recommended. No acute fracture. Decubitus
ulcer over the left greater trochanter and ischial tuberosities. No
fluid collection or abscess. Old healed right femoral fracture and
partially visualized right femoral fixation hardware. Heterotopic
ossification of the soft tissues about the left greater trochanter
likely related to chronic infection.
IMPRESSION: 1. Mucous impaction in the lower lobe bronchi with findings
concerning for bilateral lower lobe pneumonia. Clinical correlation
is recommended.
2. Postsurgical changes of the bowel. No evidence of bowel
obstruction. No fluid collection or abscess.
3.  Aortic Atherosclerosis ([id number]).
4. Right iliac artery endovascular stent graft.
5. Ovoid soft tissue densities along the spine to the right of the
aorta concerning for lymphadenopathy. Clinical correlation is
recommended.
6. Osseous metastatic disease.
